# Patient Record
Sex: Female | Born: 1950 | Race: White | Hispanic: No | Marital: Married | Smoking: Former smoker
Health system: Southern US, Community
[De-identification: ages and names within clinical notes are randomized; demographics above are authoritative.]

## PROBLEM LIST (undated history)

## (undated) DIAGNOSIS — M8580 Other specified disorders of bone density and structure, unspecified site: Secondary | ICD-10-CM

## (undated) DIAGNOSIS — H409 Unspecified glaucoma: Secondary | ICD-10-CM

## (undated) DIAGNOSIS — J45909 Unspecified asthma, uncomplicated: Secondary | ICD-10-CM

## (undated) DIAGNOSIS — E7800 Pure hypercholesterolemia, unspecified: Secondary | ICD-10-CM

## (undated) DIAGNOSIS — K219 Gastro-esophageal reflux disease without esophagitis: Secondary | ICD-10-CM

## (undated) DIAGNOSIS — K589 Irritable bowel syndrome without diarrhea: Secondary | ICD-10-CM

## (undated) DIAGNOSIS — F329 Major depressive disorder, single episode, unspecified: Secondary | ICD-10-CM

## (undated) DIAGNOSIS — E049 Nontoxic goiter, unspecified: Secondary | ICD-10-CM

## (undated) DIAGNOSIS — F32A Depression, unspecified: Secondary | ICD-10-CM

## (undated) HISTORY — DX: Depression, unspecified: F32.A

## (undated) HISTORY — DX: Irritable bowel syndrome without diarrhea: K58.9

## (undated) HISTORY — DX: Unspecified asthma, uncomplicated: J45.909

## (undated) HISTORY — DX: Other specified disorders of bone density and structure, unspecified site: M85.80

## (undated) HISTORY — PX: TOTAL KNEE ARTHROPLASTY: SHX125

## (undated) HISTORY — DX: Major depressive disorder, single episode, unspecified: F32.9

## (undated) HISTORY — DX: Nontoxic goiter, unspecified: E04.9

## (undated) HISTORY — DX: Unspecified glaucoma: H40.9

## (undated) HISTORY — PX: COLONOSCOPY: SHX174

## (undated) HISTORY — PX: CHOLECYSTECTOMY: SHX55

## (undated) HISTORY — PX: ABDOMINAL HERNIA REPAIR: SHX539

## (undated) HISTORY — DX: Pure hypercholesterolemia, unspecified: E78.00

## (undated) HISTORY — DX: Gastro-esophageal reflux disease without esophagitis: K21.9

## (undated) HISTORY — PX: APPENDECTOMY: SHX54

---

## 2007-05-06 LAB — PULMONARY FUNCTION TEST

## 2010-02-01 ENCOUNTER — Inpatient Hospital Stay (HOSPITAL_COMMUNITY)
Admission: RE | Admit: 2010-02-01 | Discharge: 2010-02-04 | Payer: Self-pay | Source: Home / Self Care | Attending: Orthopedic Surgery | Admitting: Orthopedic Surgery

## 2010-02-08 LAB — BASIC METABOLIC PANEL
BUN: 5 mg/dL — ABNORMAL LOW (ref 6–23)
BUN: 5 mg/dL — ABNORMAL LOW (ref 6–23)
CO2: 31 mEq/L (ref 19–32)
CO2: 30 mEq/L (ref 19–32)
Calcium: 9 mg/dL (ref 8.4–10.5)
Calcium: 9.1 mg/dL (ref 8.4–10.5)
Chloride: 99 mEq/L (ref 96–112)
Chloride: 100 mEq/L (ref 96–112)
Creatinine, Ser: 0.74 mg/dL (ref 0.4–1.2)
Creatinine, Ser: 0.62 mg/dL (ref 0.4–1.2)
GFR calc Af Amer: >60 mL/min (ref >60)
GFR calc Af Amer: >60 mL/min (ref >60)
GFR calc non Af Amer: >60 mL/min (ref >60)
GFR calc non Af Amer: >60 mL/min (ref >60)
Glucose, Bld: 168 mg/dL — ABNORMAL HIGH (ref 70–99)
Glucose, Bld: 164 mg/dL — ABNORMAL HIGH (ref 70–99)
Potassium: 5 mEq/L (ref 3.5–5.1)
Potassium: 4.1 mEq/L (ref 3.5–5.1)
Sodium: 137 mEq/L (ref 135–145)
Sodium: 136 mEq/L (ref 135–145)

## 2010-02-08 LAB — TYPE AND SCREEN
ABO/RH(D): A POS
Antibody Screen: NEGATIVE

## 2010-02-08 LAB — CBC
HCT: 35.3 % — ABNORMAL LOW (ref 36.0–46.0)
HCT: 33.7 % — ABNORMAL LOW (ref 36.0–46.0)
HCT: 33 % — ABNORMAL LOW (ref 36.0–46.0)
Hemoglobin: 11.1 g/dL — ABNORMAL LOW (ref 12.0–15.0)
Hemoglobin: 10.8 g/dL — ABNORMAL LOW (ref 12.0–15.0)
Hemoglobin: 10.5 g/dL — ABNORMAL LOW (ref 12.0–15.0)
MCH: 29.4 pg (ref 26.0–34.0)
MCH: 29.7 pg (ref 26.0–34.0)
MCH: 29.4 pg (ref 26.0–34.0)
MCHC: 31.4 g/dL (ref 30.0–36.0)
MCHC: 32 g/dL (ref 30.0–36.0)
MCHC: 31.8 g/dL (ref 30.0–36.0)
MCV: 93.6 fL (ref 78.0–100.0)
MCV: 92.6 fL (ref 78.0–100.0)
MCV: 92.4 fL (ref 78.0–100.0)
Platelets: 237 10*3/uL (ref 150–400)
Platelets: 214 10*3/uL (ref 150–400)
Platelets: 232 10*3/uL (ref 150–400)
RBC: 3.77 MIL/uL — ABNORMAL LOW (ref 3.87–5.11)
RBC: 3.64 MIL/uL — ABNORMAL LOW (ref 3.87–5.11)
RBC: 3.57 MIL/uL — ABNORMAL LOW (ref 3.87–5.11)
RDW: 13.4 % (ref 11.5–15.5)
RDW: 13.2 % (ref 11.5–15.5)
RDW: 13.3 % (ref 11.5–15.5)
WBC: 11.6 10*3/uL — ABNORMAL HIGH (ref 4.0–10.5)
WBC: 13.3 10*3/uL — ABNORMAL HIGH (ref 4.0–10.5)
WBC: 12.8 10*3/uL — ABNORMAL HIGH (ref 4.0–10.5)

## 2010-02-08 LAB — PROTIME-INR
INR: 1.06 (ref 0.00–1.49)
INR: 1.15 (ref 0.00–1.49)
INR: 1.29 (ref 0.00–1.49)
Prothrombin Time: 14 seconds (ref 11.6–15.2)
Prothrombin Time: 14.9 seconds (ref 11.6–15.2)
Prothrombin Time: 16.3 seconds — ABNORMAL HIGH (ref 11.6–15.2)

## 2010-02-08 LAB — ABO/RH: ABO/RH(D): A POS

## 2010-02-24 NOTE — Discharge Summary (Signed)
Holly Odom, Holly Odom                ACCOUNT NO.:  [id number]  MEDICAL RECORD NO.:  [id number]          PATIENT TYPE:  INP  LOCATION:  1607                         FACILITY:  [facility]  PHYSICIAN:  Ollen Gross, M.D.    DATE OF BIRTH:  1950-12-19  DATE OF ADMISSION:  02/01/2010 DATE OF DISCHARGE:  02/04/2010                              DISCHARGE SUMMARY   ADMITTING DIAGNOSES: 1. Osteoarthritis, left knee. 2. Anxiety. 3. Open-angle glaucoma. 4. Hypercholesterolemia. 5. Varicose veins. 6. Hiatal hernia. 7. Reflux disease. 8. Hemorrhoids. 9. History of gallbladder disease. 10.Irritable bowel syndrome. 11.Urinary incontinence. 12.Past history of urinary tract infection. 13.Hypothyroidism. 14.Osteopenia. 15.History of fractures, fifth metatarsal 16.Degenerative disk disease. 17.Childhood illnesses of scarlet fever, measles, mumps and strep     infections. 18.Postmenopausal.  DISCHARGE DIAGNOSES: 1. Osteoarthritis, left knee status post left total knee replacement     arthroplasty. 2. Anxiety. 3. Open-angle glaucoma. 4. Hypercholesterolemia. 5. Varicose veins. 6. Hiatal hernia. 7. Reflux disease. 8. Hemorrhoids. 9. History of gallbladder disease. 10.Irritable bowel syndrome. 11.Urinary incontinence. 12.Past history of urinary tract infection. 13.Hypothyroidism. 14.Osteopenia. 15.History of fractures, fifth metatarsal 16.Degenerative disk disease. 17.Childhood illnesses of scarlet fever, measles, mumps and strep     infections. 18.Postmenopausal.  PROCEDURE:  February 01, 2010, left total knee.  SURGEON:  Ollen Gross, MD  ASSISTANT:  Rozell Searing, [facility]  ANESTHESIA:  General.  TOURNIQUET TIME:  37 minutes.  CONSULTS:  None.  BRIEF HISTORY:  [name] is a 60 year old female with end-stage arthritis, left knee, progressive worsening, pain, dysfunction, failed non-operative management now presents for total knee arthroplasty.  LABORATORY DATA:  Preoperative  CBC showed a hemoglobin of 12.7, hematocrit of 39.9, white cell count 8.4, platelets 279.  PT/INR 13.5 and 1.01 with a PTT of 27.  Chemistry panel on admission slightly elevated SGPT at 45.  Remaining chemistry panel within normal limits. Preoperative UA was negative.  Blood group type A+.  Nasal swabs were negative staph aureus and MRSA.  Serial CBCs were followed.  Hemoglobin dropped down to 11.1 and 10.8, which stabilized at 10.5 with a hematocrit of 33.0.  White count went up from 8.4 to 13.3 back down to 12.8.  Serial prothrombin times followed per Coumadin protocol.  Last done PT/INR 16.3 and 1.29.  She was given Lovenox prior to discharge. Serial BMETs were followed for 48 hours.  Electrolytes remained within normal limits.  Glucose did go up from 111 to 168, was trending back down and it is down to 164.  DIAGNOSTIC STUDIES:  EKG January 26, 2010, normal sinus rhythm, cannot rule out anterior infarct, age undetermined, confirmed by Dr. Willa Rough.  Please note that the patient had previous EKGs, one dating backto 2009 where she had the same EKG changes and they were unchanged and this was shown to anesthesia.  HOSPITAL COURSE:  The patient was admitted to [facility], taken to OR, underwent above-stated procedure without complication.  The patient tolerated procedure well, later transferred to recovery room, then orthopedic floor started on p.o. and IV analgesic.  Pain control following surgery, given 24 hours postoperative IV antibiotics.  She was started  back on her home medications.  Hemoglobin looked good at 11.1. Started to get up and out of bed on day 1.  She wanted to go home, so we got discharge planning involved.  By day 2, she was already walking about 60 feet.  She had a little bit of drop in her O2 saturations.  She had gotten down to the 80s in the evening and through the night, felt to be due to some sedation because she was back up in the 90s with O2  and also during the day when she was awake and alert.  So we started getting her up with therapy.  Hemoglobin was 10.8.  Dressing changed on day 2. Incision looked good.  No signs of infection.  She was seen back on rounds on day 3 and still has a little bit of drops in the 80s, but she was on room air and doing 90% when she was awake and alert.  Felt to be more of a sedation type issues.  She was progressing well with her therapy though, and she was meeting her goals and she wanted to go home.  DISCHARGE/PLAN: 1. The patient discharged home on February 04, 2010. 2. Discharge diagnoses:  Please see above.  DISCHARGE MEDICATIONS: 1. Percocet. 2. Robaxin. 3. Coumadin. 4. Lovenox. 5. Continue the citalopram. 6. Fenofibrate. 7. Toviaz. 8. Latanoprost ophthalmic drops. 9. Levothyroxine. 10.Omeprazole. 11.Pravachol.  DIET:  Heart-healthy diet, low-cholesterol diet.  ACTIVITY:  Weightbearing as tolerated.  Total knee protocol.  Home health PT and home health nursing.  FOLLOWUP:  In 2 weeks.  DISPOSITION:  Home.  CONDITION ON DISCHARGE:  Improved.     Alexzandrew L. Julien Girt, P.A.C.   ______________________________ Ollen Gross, M.D.    ALP/MEDQ  D:  02/04/2010  T:  02/04/2010  Job:  [id number]  cc:   Quita Skye. Artis Flock, M.D. Fax: [phone]  Electronically Signed by Patrica Duel P.A.C. on 02/11/2010 11:09:59 AM Electronically Signed by Ollen Gross M.D. on 02/24/2010 03:34:00 PM

## 2010-04-05 LAB — CBC
HCT: 39.9 % (ref 36.0–46.0)
Hemoglobin: 12.7 g/dL (ref 12.0–15.0)
MCH: 29.3 pg (ref 26.0–34.0)
MCHC: 31.8 g/dL (ref 30.0–36.0)
MCV: 91.9 fL (ref 78.0–100.0)
Platelets: 279 10*3/uL (ref 150–400)
RBC: 4.34 MIL/uL (ref 3.87–5.11)
RDW: 13.1 % (ref 11.5–15.5)
WBC: 8.4 10*3/uL (ref 4.0–10.5)

## 2010-04-05 LAB — SURGICAL PCR SCREEN
MRSA, PCR: NEGATIVE
Staphylococcus aureus: NEGATIVE

## 2010-04-05 LAB — COMPREHENSIVE METABOLIC PANEL
ALT: 45 U/L — ABNORMAL HIGH (ref 0–35)
AST: 36 U/L (ref 0–37)
Albumin: 3.8 g/dL (ref 3.5–5.2)
Alkaline Phosphatase: 62 U/L (ref 39–117)
BUN: 11 mg/dL (ref 6–23)
CO2: 28 mEq/L (ref 19–32)
Calcium: 9.7 mg/dL (ref 8.4–10.5)
Chloride: 105 mEq/L (ref 96–112)
Creatinine, Ser: 0.73 mg/dL (ref 0.4–1.2)
GFR calc Af Amer: >60 mL/min (ref >60)
GFR calc non Af Amer: >60 mL/min (ref >60)
Glucose, Bld: 111 mg/dL — ABNORMAL HIGH (ref 70–99)
Potassium: 3.7 mEq/L (ref 3.5–5.1)
Sodium: 143 mEq/L (ref 135–145)
Total Bilirubin: 0.5 mg/dL (ref 0.3–1.2)
Total Protein: 6.9 g/dL (ref 6.0–8.3)

## 2010-04-05 LAB — URINALYSIS, ROUTINE W REFLEX MICROSCOPIC
Bilirubin Urine: NEGATIVE
Glucose, UA: NEGATIVE mg/dL
Hgb urine dipstick: NEGATIVE
Ketones, ur: NEGATIVE mg/dL
Nitrite: NEGATIVE
Protein, ur: NEGATIVE mg/dL
Specific Gravity, Urine: 1.014 (ref 1.005–1.030)
Urobilinogen, UA: 0.2 mg/dL (ref 0.0–1.0)
pH: 6.5 (ref 5.0–8.0)

## 2010-04-05 LAB — PROTIME-INR
INR: 1.01 (ref 0.00–1.49)
Prothrombin Time: 13.5 seconds (ref 11.6–15.2)

## 2010-04-05 LAB — APTT: aPTT: 27 seconds (ref 24–37)

## 2011-09-08 ENCOUNTER — Encounter: Payer: Self-pay | Admitting: *Deleted

## 2011-09-09 ENCOUNTER — Encounter: Payer: Self-pay | Admitting: Internal Medicine

## 2011-09-09 ENCOUNTER — Ambulatory Visit (INDEPENDENT_AMBULATORY_CARE_PROVIDER_SITE_OTHER): Payer: BC Managed Care – PPO | Admitting: Internal Medicine

## 2011-09-09 VITALS — BP 102/62 | HR 83 | Temp 97.6°F | Ht 62.0 in | Wt 175.4 lb

## 2011-09-09 DIAGNOSIS — J310 Chronic rhinitis: Secondary | ICD-10-CM

## 2011-09-09 DIAGNOSIS — R05 Cough: Secondary | ICD-10-CM | POA: Insufficient documentation

## 2011-09-09 MED ORDER — TRAMADOL HCL 50 MG PO TABS: ORAL_TABLET | ORAL | Status: AC

## 2011-09-09 MED ORDER — PREDNISONE (PAK) 10 MG PO TABS: ORAL_TABLET | ORAL | Status: AC

## 2011-09-09 MED ORDER — OMEPRAZOLE 20 MG PO CPDR: DELAYED_RELEASE_CAPSULE | ORAL | Status: DC

## 2011-09-09 MED ORDER — MONTELUKAST SODIUM 10 MG PO TABS: 10.0000 mg | ORAL_TABLET | Freq: Every day | ORAL | Status: DC

## 2011-09-09 MED ORDER — FAMOTIDINE 20 MG PO TABS: ORAL_TABLET | ORAL | Status: AC

## 2011-09-09 NOTE — Assessment & Plan Note (Signed)
This may be the "spark" but not the fuel for the chronic cough  Since may have responded to singulair previously and can't cause a cough, ok to resume

## 2011-09-09 NOTE — Patient Instructions (Addendum)
The key to effective treatment for your cough is eliminating the non-stop cycle of cough you're stuck in long enough to let your airway heal completely and then see if there is anything still making you cough once you stop the cough suppression, but this should take no more than 5 days to figuure out  First take delsym two tsp every 12 hours and supplement if needed with  tramadol 50 mg up to 2 every 4 hours to suppress the urge to cough. Swallowing water or using ice chips/non mint and menthol containing candies (such as lifesavers or sugarless jolly ranchers) are also effective.  You should rest your voice and avoid activities that you know make you cough.  Once you have eliminated the cough for 3 straight days try reducing the tramadol first,  then the delsym as tolerated.    Try prilosec 20mg   Take 30-60 min before first  And last meal of the day and Pepcid 20 mg one bedtime   GERD (REFLUX)  is an extremely common cause of respiratory symptoms, many times with no significant heartburn at all.    It can be treated with medication, but also with lifestyle changes including avoidance of late meals, excessive alcohol, smoking cessation, and avoid fatty foods, chocolate, peppermint, colas, red wine, and acidic juices such as orange juice.  NO MINT OR MENTHOL PRODUCTS SO NO COUGH DROPS  USE SUGARLESS CANDY INSTEAD (jolley ranchers or Stover's)  NO OIL BASED VITAMINS - use powdered substitutes.    Prednisone 10 mg take  4 each am x 2 days,   2 each am x 2 days,  1 each am x2days and stop    Resume singulair 10 mg daily  The main indication for wearing cpap is daytime drowsiness > if this becomes a problem I would be happy to refer you to one of our specialists  Please schedule a follow up office visit in 6 weeks, call sooner if needed

## 2011-09-09 NOTE — Progress Notes (Signed)
  Subjective:    Patient ID: Holly Odom, female    DOB: Sep 18, 1950  MRN: [id number]  HPI  78 yowf grew up in house of smokers and quit in 1985 with no resp whatsoever until recurrent sinus problems since 1987 spring/fall then Nov 2012 with onset of cough and referred 09/09/2011 to pulmonary clinic.  09/09/2011 1st pulmonary eval for persistent cough since Nov 2012 most noticeable in am tiny amt of amt of mucus not better from  abx but prednisone needed no sinus CT, worse symbicort and bending over makes it worse, takes protonix at hs.  Gets so bad coughs/gags/urinary incont when severe but never more productive.  singulair may have helped sinuses but didn't maintain it.  Sleeping ok without nocturnal  or early am exacerbation  of respiratory  c/o's or need for noct saba. Also denies any obvious fluctuation of symptoms with weather or environmental changes or other aggravating or alleviating factors except as outlined above     Review of Systems  Constitutional: Negative for fever, chills, diaphoresis, activity change, appetite change, fatigue and unexpected weight change.  HENT: Positive for congestion, rhinorrhea, sneezing, postnasal drip and sinus pressure. Negative for nosebleeds, sore throat, trouble swallowing, voice change and tinnitus.   Eyes: Negative for visual disturbance.  Respiratory: Positive for cough, shortness of breath and wheezing. Negative for choking, chest tightness and stridor.   Cardiovascular: Negative for chest pain and palpitations.  Gastrointestinal: Positive for constipation. Negative for nausea, vomiting and blood in stool.  Genitourinary: Negative for difficulty urinating.  Musculoskeletal: Negative for gait problem.  Skin: Negative for rash.  Neurological: Negative for dizziness, tremors, weakness, light-headedness, numbness and headaches.  Hematological: Does not bruise/bleed easily.  Psychiatric/Behavioral: Positive for disturbed wake/sleep cycle. Negative for  confusion and agitation. The patient is not nervous/anxious.        Objective:   Physical Exam  amb wf nad    HEENT: nl dentition, turbinates, and orophanx. Nl external ear canals without cough reflex    NECK :  without JVD/Nodes/TM/ nl carotid upstrokes bilaterally   LUNGS: no acc muscle use, clear to A and P bilaterally without cough on insp or exp maneuvers   CV:  RRR  no s3 or murmur or increase in P2, no edema   ABD:  soft and nontender with nl excursion in the supine position. No bruits or organomegaly, bowel sounds nl  MS:  warm without deformities, calf tenderness, cyanosis or clubbing  SKIN: warm and dry without lesions    NEURO:  alert, approp, no deficits  CT chest 03/18/11 No active abnormalities per rad report from [facility]       Assessment & Plan:

## 2011-09-09 NOTE — Assessment & Plan Note (Addendum)
The most common causes of chronic cough in immunocompetent adults include the following: upper airway cough syndrome (UACS), previously referred to as postnasal drip syndrome (PNDS), which is caused by variety of rhinosinus conditions; (2) asthma; (3) GERD; (4) chronic bronchitis from cigarette smoking or other inhaled environmental irritants; (5) nonasthmatic eosinophilic bronchitis; and (6) bronchiectasis.   These conditions, singly or in combination, have accounted for up to 94% of the causes of chronic cough in prospective studies.   Other conditions have constituted no >6% of the causes in prospective studies These have included bronchogenic carcinoma, chronic interstitial pneumonia, sarcoidosis, left ventricular failure, ACEI-induced cough, and aspiration from a condition associated with pharyngeal dysfunction.   .Chronic cough is often simultaneously caused by more than one condition. A single cause has been found from 38 to 82% of the time, multiple causes from 18 to 62%. Multiply caused cough has been the result of three diseases up to 42% of the time.     Most likely this is nothing more than  Classic Upper airway cough syndrome, so named because it's frequently impossible to sort out how much is  CR/sinusitis with freq throat clearing (which can be related to primary GERD)   vs  causing  secondary (" extra esophageal")  GERD from wide swings in gastric pressure that occur with throat clearing, often  promoting self use of mint and menthol lozenges that reduce the lower esophageal sphincter tone and exacerbate the problem further in a cyclical fashion.   These are the same pts (now being labeled as having "irritable larynx syndrome" by some cough centers) who not infrequently have a history of having failed to tolerate ace inhibitors,  dry powder inhalers (or even hfa inhalers, as me be the case here)or biphosphonates or report having atypical reflux symptoms that don't respond to standard doses  of PPI , and are easily confused as having aecopd or asthma flares by even experienced allergists/ pulmonologists.  Discussed with pt The standardized cough guidelines recently published in Chest by [place] in 2006  are a multiple step process (up to 12!) , not a single office visit,  and are intended  to address this problem logically,  with an alogrithm dependent on response to empiric treatment at  each progressive step  to determine a specific diagnosis with  minimal addtional testing needed. Therefore if compliance is an issue or can't be accurately verified then it's very unlikely the standard evaluation and treatment will be successful here.    Furthermore, response to therapy (other than acute cough suppression, which should only be used short term with avoidance of narcotic containing cough syrups if possible), can be a gradual process for which the patient may not receive immediate benefit.  Unlike going to an eye doctor where the right rx is almost always the first one and is immediately effective, this is almost never the case in the management of chronic cough syndromes and the patient needs to commit up front to compliance with recommendations and have the patience to wait out a response for up to 6 weeks of therapy directed at the likely underlying problem(s).    For now rx max for gerd then sinus ct next step if not improving.

## 2011-10-25 ENCOUNTER — Ambulatory Visit (INDEPENDENT_AMBULATORY_CARE_PROVIDER_SITE_OTHER)
Admission: RE | Admit: 2011-10-25 | Discharge: 2011-10-25 | Disposition: A | Discharge status: Home / Self Care | Payer: BC Managed Care – PPO | Source: Ambulatory Visit | Attending: Internal Medicine | Admitting: Internal Medicine

## 2011-10-25 ENCOUNTER — Encounter: Payer: Self-pay | Admitting: Internal Medicine

## 2011-10-25 ENCOUNTER — Ambulatory Visit (INDEPENDENT_AMBULATORY_CARE_PROVIDER_SITE_OTHER): Payer: BC Managed Care – PPO | Admitting: Internal Medicine

## 2011-10-25 VITALS — BP 102/68 | HR 90 | Temp 97.8°F | Ht 62.0 in | Wt 172.6 lb

## 2011-10-25 DIAGNOSIS — R05 Cough: Secondary | ICD-10-CM

## 2011-10-25 DIAGNOSIS — Z23 Encounter for immunization: Secondary | ICD-10-CM

## 2011-10-25 DIAGNOSIS — J310 Chronic rhinitis: Secondary | ICD-10-CM

## 2011-10-25 MED ORDER — TRAMADOL HCL 50 MG PO TABS: 50.0000 mg | ORAL_TABLET | Freq: Four times a day (QID) | ORAL | Status: AC | PRN

## 2011-10-25 NOTE — Patient Instructions (Addendum)
Try add back zyrtec 10 mg one at bedtime to see what effect it has on your am cough  Please see patient coordinator before you leave today  to schedule sinus ct  Continue high suppression prilosec 40 mg Take 30-60 min before first meal of the day and famotidine 20mg  one at bedtime  Please schedule a follow up office visit in 4 weeks, sooner if needed

## 2011-10-25 NOTE — Progress Notes (Signed)
Subjective:    Patient ID: Holly Odom, female    DOB: 06/01/1950  MRN: [id number]  HPI  13 yowf grew up in house of smokers and quit in 1985 with no resp whatsoever until recurrent sinus problems since 1987 spring/fall then Nov 2012 with onset of cough and referred 09/09/2011 to pulmonary clinic.  09/09/2011 1st pulmonary eval for persistent cough since Nov 2012 most noticeable in am tiny amt of mucus not better from  abx but prednisone needed no sinus CT, worse symbicort and bending over makes it worse, takes protonix at hs.  Gets so bad coughs/gags/urinary incont when severe but never more productive.  singulair may have helped sinuses but didn't maintain it. rec The key to effective treatment for your cough is eliminating the non-stop cycle of cough you're stuck in long enough to let your airway heal completely and then see if there is anything still making you cough once you stop the cough suppression, but this should take no more than 5 days to figure out First take delsym two tsp every 12 hours and supplement if needed with  tramadol 50 mg up to 2 every 4 hours to suppress the urge to cough  Try prilosec 20mg   Take 30-60 min before first  And last meal of the day and Pepcid 20 mg one bedtime  GERD diet Prednisone 10 mg take  4 each am x 2 days,   2 each am x 2 days,  1 each am x2days and stop   Resume singulair 10 mg daily The main indication for wearing cpap is daytime drowsiness > if this becomes a problem I would be happy to refer you to one of our specialists   10/25/2011 f/u ov/Renu Asby cc a little raspy cough in am p stirring around, min sputum production,   Better p drink something, no longer taking zyrtec. No obvious daytime variabilty or assoc sob  cp or chest tightness, subjective wheeze overt sinus or hb symptoms. No unusual exp hx    Sleeping ok without nocturnal  or early am exacerbation  of respiratory  c/o's or need for noct saba. Also denies any obvious fluctuation of symptoms  with weather or environmental changes or other aggravating or alleviating factors except as outlined above   ROS  The following are not active complaints unless bolded sore throat, dysphagia, dental problems, itching, sneezing,  nasal congestion or excess/ purulent secretions, ear ache,   fever, chills, sweats, unintended wt loss, pleuritic or exertional cp, hemoptysis,  orthopnea pnd or leg swelling, presyncope, palpitations, heartburn, abdominal pain, anorexia, nausea, vomiting, diarrhea  or change in bowel or urinary habits, change in stools or urine, dysuria,hematuria,  rash, arthralgias, visual complaints, headache, numbness weakness or ataxia or problems with walking or coordination,  change in mood/affect or memory.            .        Objective:   Physical Exam  Wt Readings from Last 3 Encounters:  10/25/11 172 lb 9.6 oz (78.291 kg)  09/09/11 175 lb 6.4 oz (79.561 kg)     amb wf nad    HEENT: nl dentition, turbinates, and orophanx. Nl external ear canals without cough reflex    NECK :  without JVD/Nodes/TM/ nl carotid upstrokes bilaterally   LUNGS: no acc muscle use, clear to A and P bilaterally without cough on insp or exp maneuvers   CV:  RRR  no s3 or murmur or increase in P2, no edema   ABD:  soft and nontender with nl excursion in the supine position. No bruits or organomegaly, bowel sounds nl  MS:  warm without deformities, calf tenderness, cyanosis or clubbing    CT chest 03/18/11 No active abnormalities per rad report from [facility]       Assessment & Plan:

## 2011-10-26 NOTE — Progress Notes (Signed)
Quick Note:  Spoke with pt and notified of results per Dr. Wert. Pt verbalized understanding and denied any questions.  ______ 

## 2011-10-28 NOTE — Assessment & Plan Note (Signed)
-   PFTs wnl 05/06/2007 - Sinus CT ok 10/25/11  Still feel this is most c/w  Classic Upper airway cough syndrome, so named because it's frequently impossible to sort out how much is  CR/sinusitis with freq throat clearing (which can be related to primary GERD)   vs  causing  secondary (" extra esophageal")  GERD from wide swings in gastric pressure that occur with throat clearing, often  promoting self use of mint and menthol lozenges that reduce the lower esophageal sphincter tone and exacerbate the problem further in a cyclical fashion.   These are the same pts (now being labeled as having "irritable larynx syndrome" by some cough centers) who not infrequently have a history of having failed to tolerate ace inhibitors,  dry powder inhalers or biphosphonates or report having atypical reflux symptoms that don't respond to standard doses of PPI , and are easily confused as having aecopd or asthma flares by even experienced allergists/ pulmonologists.  Will therefore continue high dose acid suppression and add zyrtec qhs (other option chlortrimeton per guidelines) as she has at least partially responded to rx to date.

## 2011-10-28 NOTE — Assessment & Plan Note (Signed)
-   singulair maint rx 09/09/2011 >>>    - Sinus CT 10/25/11 > Clear paranasal sinuses  Add zyrtec back prn

## 2011-11-22 ENCOUNTER — Encounter: Payer: Self-pay | Admitting: Internal Medicine

## 2011-11-24 ENCOUNTER — Telehealth: Payer: Self-pay | Admitting: Internal Medicine

## 2011-11-24 MED ORDER — MONTELUKAST SODIUM 10 MG PO TABS: 10.0000 mg | ORAL_TABLET | Freq: Every day | ORAL | Status: AC

## 2011-11-24 NOTE — Telephone Encounter (Signed)
Rx has been sent to [facility]. Pt aware.

## 2011-11-25 ENCOUNTER — Ambulatory Visit: Payer: BC Managed Care – PPO | Admitting: Internal Medicine

## 2011-12-09 ENCOUNTER — Ambulatory Visit: Payer: BC Managed Care – PPO | Admitting: Internal Medicine

## 2012-01-11 ENCOUNTER — Ambulatory Visit: Payer: BC Managed Care – PPO | Admitting: Internal Medicine

## 2012-11-29 ENCOUNTER — Other Ambulatory Visit: Payer: Self-pay

## 2014-05-28 IMAGING — CT CT PARANASAL SINUSES LIMITED
1 series · 16 of 27 positions shown, 20 images · non-contrast
Comparison: None.

CLINICAL DATA: .  Sinus drainage and cough.

CT LIMITED SINUSES WITHOUT CONTRAST
TECHNIQUE: Multidetector CT images of the paranasal sinuses were
obtained in a single plane without contrast.

[Series 3: ltd sinus 3.0 h30s · axial · 0.37mm/px · z∈[+987,+1139]mm · 16 of 27 slices shown, 20 images]
[im 2/27  brain]
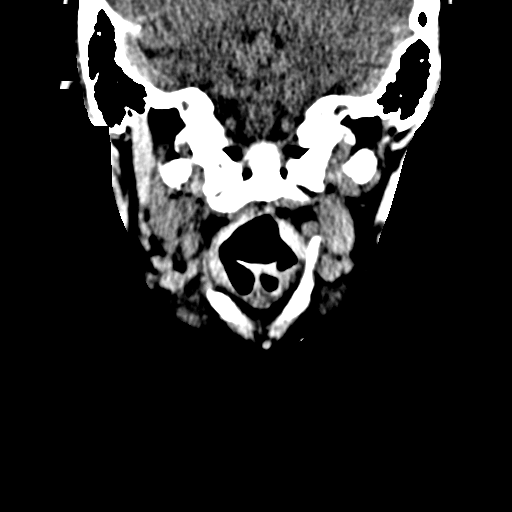
[im 2/27  bone]
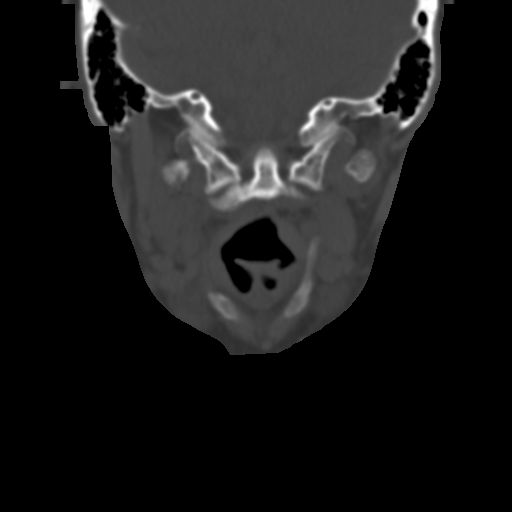
[im 4/27  bone]
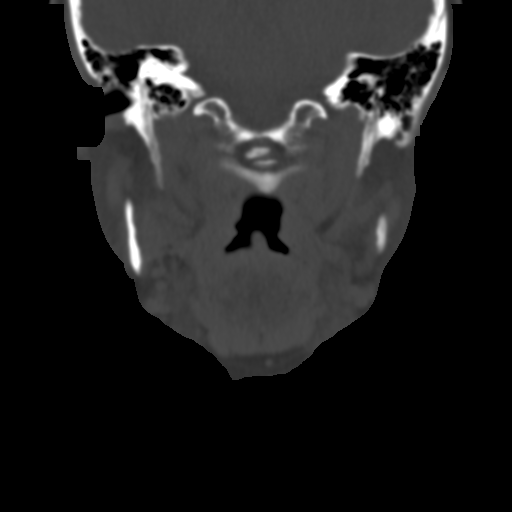
[im 5/27  bone]
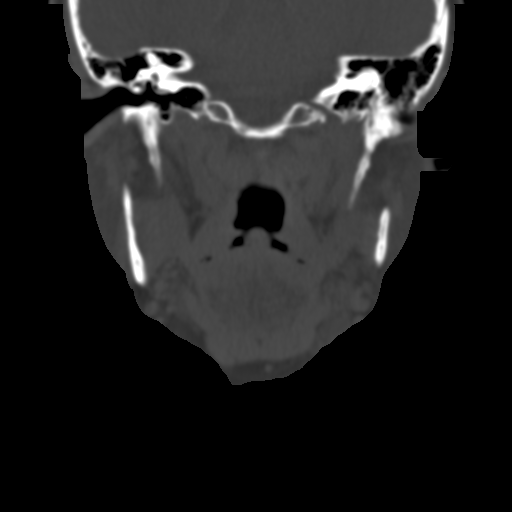
[im 7/27  bone]
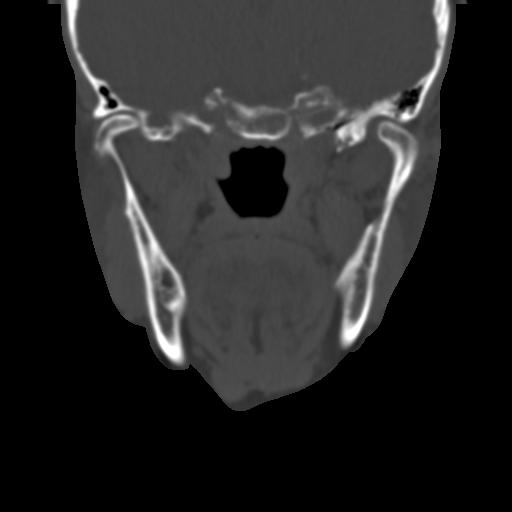
[im 9/27  brain]
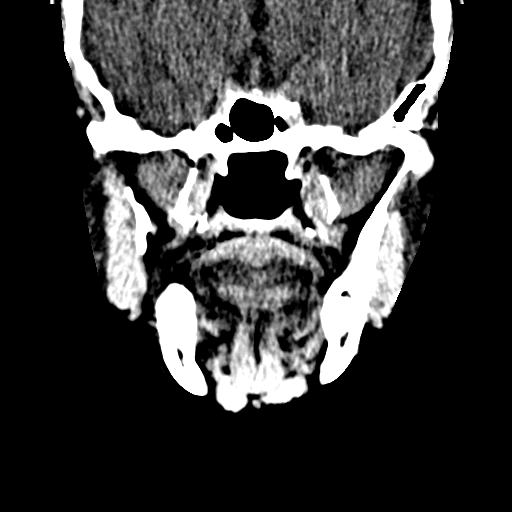
[im 9/27  bone]
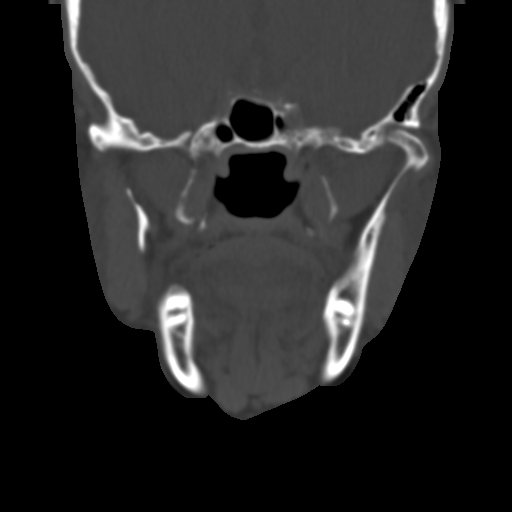
[im 10/27  bone]
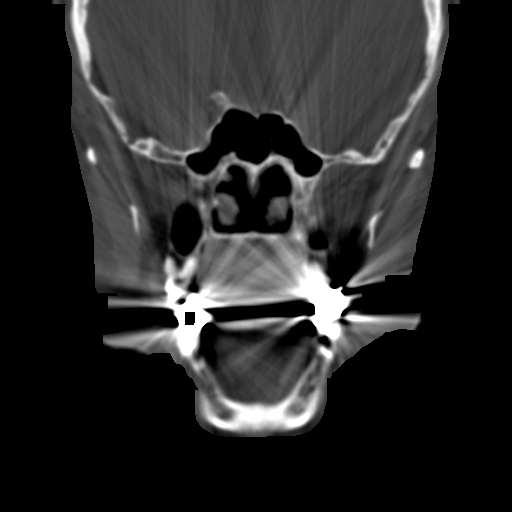
[im 12/27  bone]
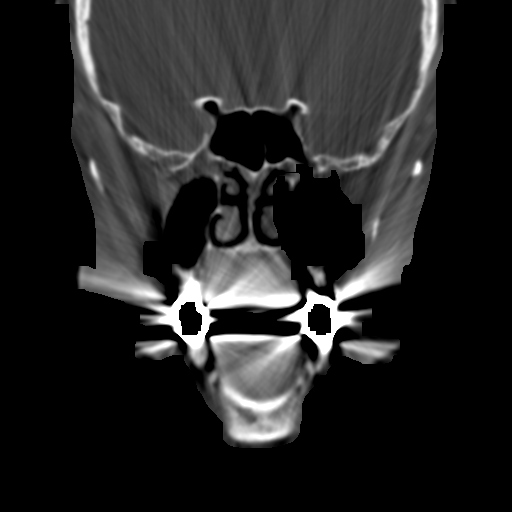
[im 13/27  bone]
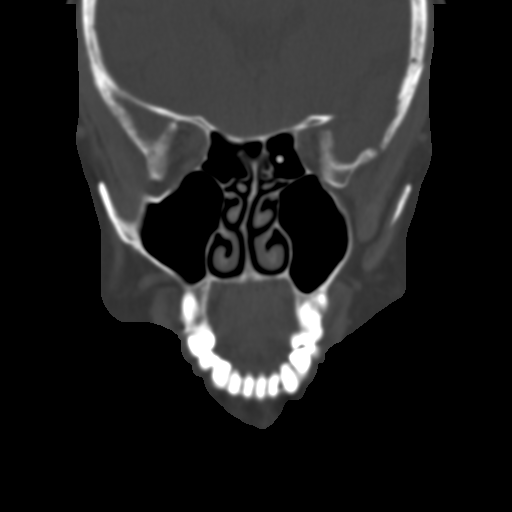
[im 15/27  brain]
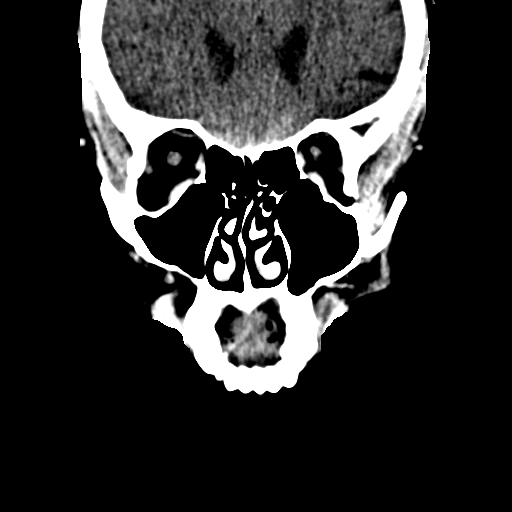
[im 15/27  bone]
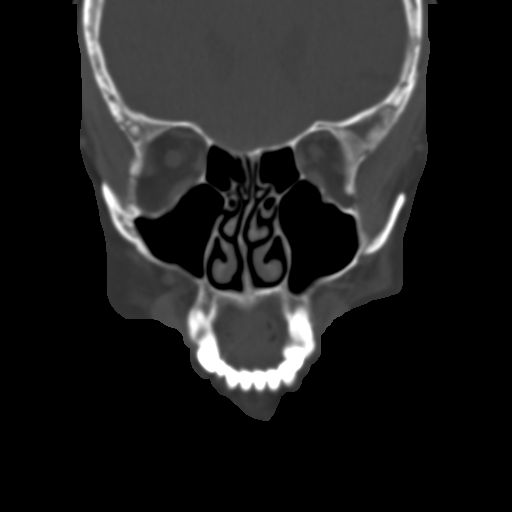
[im 16/27  bone]
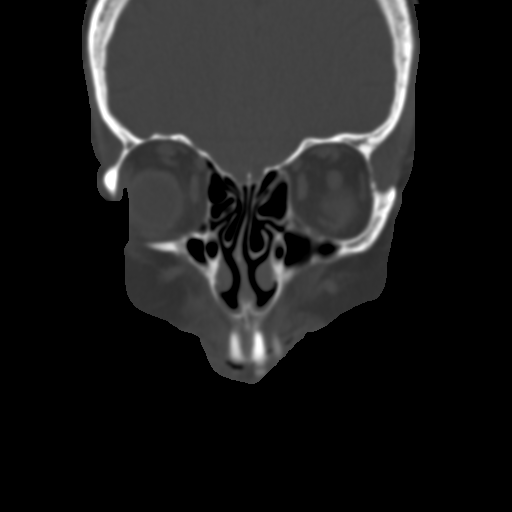
[im 18/27  bone]
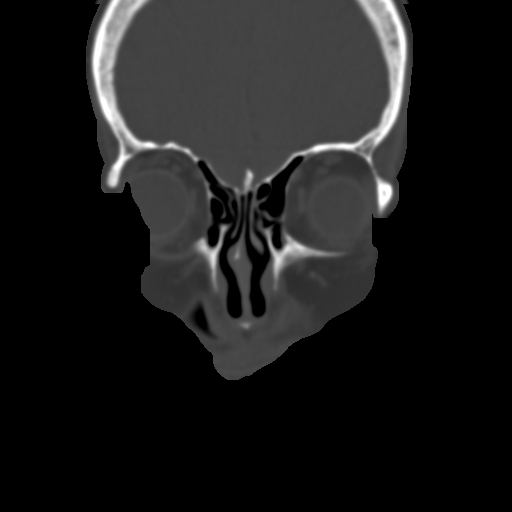
[im 19/27  bone]
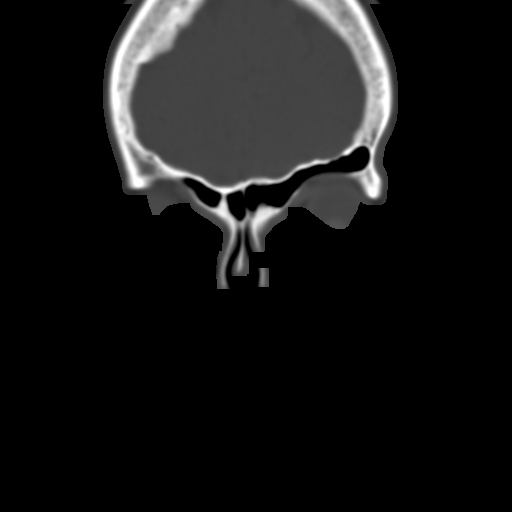
[im 21/27  brain]
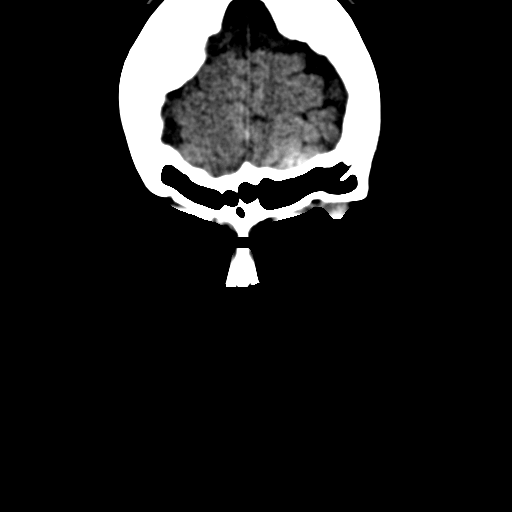
[im 21/27  bone]
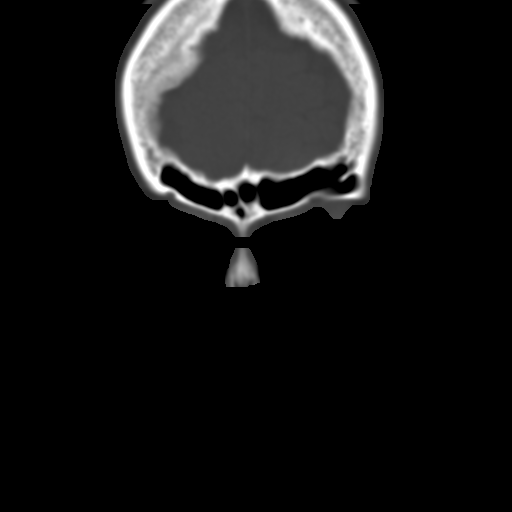
[im 23/27  bone]
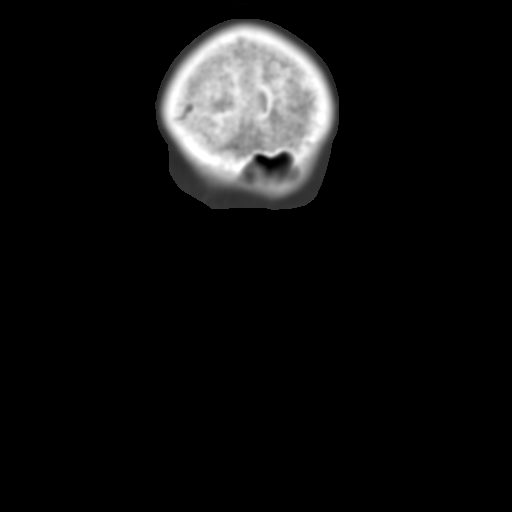
[im 24/27  bone]
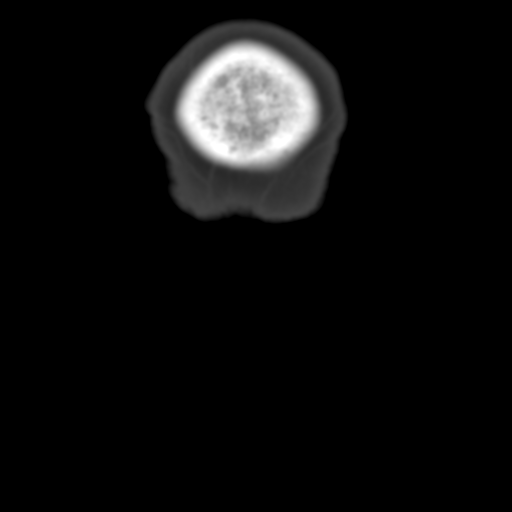
[im 26/27  bone]
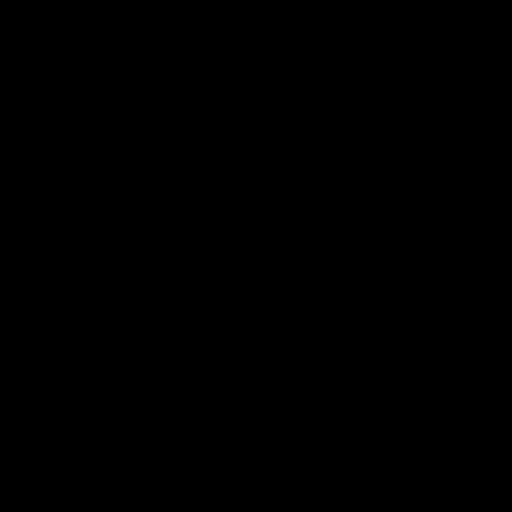

[16 of 27 positions shown; findings below may reference images not displayed]

FINDINGS: The paranasal sinuses are clear.

There is rightward deviation of the bony nasal septum and mild
narrowing of the right middle meatus. Bilateral Haller cells are
noted.  The ostiomeatal complexes are patent.  The mastoid air
cells are clear.

Degenerative changes noted involving both mandibular condyles.
IMPRESSION: Clear paranasal sinuses.

## 2016-02-05 DIAGNOSIS — J329 Chronic sinusitis, unspecified: Secondary | ICD-10-CM | POA: Diagnosis not present

## 2016-02-05 DIAGNOSIS — J40 Bronchitis, not specified as acute or chronic: Secondary | ICD-10-CM | POA: Diagnosis not present

## 2016-02-05 DIAGNOSIS — R7302 Impaired glucose tolerance (oral): Secondary | ICD-10-CM | POA: Diagnosis not present

## 2016-02-05 DIAGNOSIS — Z79899 Other long term (current) drug therapy: Secondary | ICD-10-CM | POA: Diagnosis not present

## 2016-02-05 DIAGNOSIS — E039 Hypothyroidism, unspecified: Secondary | ICD-10-CM | POA: Diagnosis not present

## 2016-03-12 DIAGNOSIS — R040 Epistaxis: Secondary | ICD-10-CM | POA: Diagnosis not present

## 2016-03-15 DIAGNOSIS — R040 Epistaxis: Secondary | ICD-10-CM | POA: Diagnosis not present

## 2016-03-16 DIAGNOSIS — R040 Epistaxis: Secondary | ICD-10-CM | POA: Diagnosis not present

## 2016-03-31 DIAGNOSIS — R040 Epistaxis: Secondary | ICD-10-CM | POA: Diagnosis not present

## 2016-04-07 DIAGNOSIS — R300 Dysuria: Secondary | ICD-10-CM | POA: Diagnosis not present

## 2016-04-18 DIAGNOSIS — R040 Epistaxis: Secondary | ICD-10-CM | POA: Diagnosis not present

## 2016-05-04 DIAGNOSIS — H401121 Primary open-angle glaucoma, left eye, mild stage: Secondary | ICD-10-CM | POA: Diagnosis not present

## 2016-07-12 DIAGNOSIS — R040 Epistaxis: Secondary | ICD-10-CM | POA: Diagnosis not present

## 2016-08-11 DIAGNOSIS — H401131 Primary open-angle glaucoma, bilateral, mild stage: Secondary | ICD-10-CM | POA: Diagnosis not present

## 2016-08-11 DIAGNOSIS — H5213 Myopia, bilateral: Secondary | ICD-10-CM | POA: Diagnosis not present

## 2016-08-30 DIAGNOSIS — R202 Paresthesia of skin: Secondary | ICD-10-CM | POA: Diagnosis not present

## 2016-08-30 DIAGNOSIS — Z6831 Body mass index (BMI) 31.0-31.9, adult: Secondary | ICD-10-CM | POA: Diagnosis not present

## 2016-08-31 DIAGNOSIS — E039 Hypothyroidism, unspecified: Secondary | ICD-10-CM | POA: Diagnosis not present

## 2016-08-31 DIAGNOSIS — Z79899 Other long term (current) drug therapy: Secondary | ICD-10-CM | POA: Diagnosis not present

## 2016-08-31 DIAGNOSIS — R7309 Other abnormal glucose: Secondary | ICD-10-CM | POA: Diagnosis not present

## 2016-08-31 DIAGNOSIS — E785 Hyperlipidemia, unspecified: Secondary | ICD-10-CM | POA: Diagnosis not present

## 2016-09-23 DIAGNOSIS — Z6832 Body mass index (BMI) 32.0-32.9, adult: Secondary | ICD-10-CM | POA: Diagnosis not present

## 2016-09-23 DIAGNOSIS — J45909 Unspecified asthma, uncomplicated: Secondary | ICD-10-CM | POA: Diagnosis not present

## 2016-09-23 DIAGNOSIS — Z124 Encounter for screening for malignant neoplasm of cervix: Secondary | ICD-10-CM | POA: Diagnosis not present

## 2016-09-23 DIAGNOSIS — Z9181 History of falling: Secondary | ICD-10-CM | POA: Diagnosis not present

## 2016-09-23 DIAGNOSIS — K219 Gastro-esophageal reflux disease without esophagitis: Secondary | ICD-10-CM | POA: Diagnosis not present

## 2016-09-23 DIAGNOSIS — E039 Hypothyroidism, unspecified: Secondary | ICD-10-CM | POA: Diagnosis not present

## 2016-09-23 DIAGNOSIS — E782 Mixed hyperlipidemia: Secondary | ICD-10-CM | POA: Diagnosis not present

## 2016-09-23 DIAGNOSIS — Z0000 Encounter for general adult medical examination without abnormal findings: Secondary | ICD-10-CM | POA: Diagnosis not present

## 2016-10-19 DIAGNOSIS — R200 Anesthesia of skin: Secondary | ICD-10-CM | POA: Diagnosis not present

## 2016-10-19 DIAGNOSIS — G5601 Carpal tunnel syndrome, right upper limb: Secondary | ICD-10-CM | POA: Diagnosis not present

## 2016-10-19 DIAGNOSIS — G5603 Carpal tunnel syndrome, bilateral upper limbs: Secondary | ICD-10-CM | POA: Diagnosis not present

## 2016-10-19 DIAGNOSIS — G5602 Carpal tunnel syndrome, left upper limb: Secondary | ICD-10-CM | POA: Diagnosis not present

## 2016-11-16 DIAGNOSIS — H401131 Primary open-angle glaucoma, bilateral, mild stage: Secondary | ICD-10-CM | POA: Diagnosis not present

## 2016-11-21 DIAGNOSIS — G5602 Carpal tunnel syndrome, left upper limb: Secondary | ICD-10-CM | POA: Diagnosis not present

## 2016-11-21 DIAGNOSIS — G5601 Carpal tunnel syndrome, right upper limb: Secondary | ICD-10-CM | POA: Diagnosis not present

## 2016-12-28 DIAGNOSIS — H401131 Primary open-angle glaucoma, bilateral, mild stage: Secondary | ICD-10-CM | POA: Diagnosis not present

## 2017-01-11 DIAGNOSIS — L821 Other seborrheic keratosis: Secondary | ICD-10-CM | POA: Diagnosis not present

## 2017-01-11 DIAGNOSIS — B079 Viral wart, unspecified: Secondary | ICD-10-CM | POA: Diagnosis not present

## 2017-01-11 DIAGNOSIS — L578 Other skin changes due to chronic exposure to nonionizing radiation: Secondary | ICD-10-CM | POA: Diagnosis not present

## 2017-01-11 DIAGNOSIS — L820 Inflamed seborrheic keratosis: Secondary | ICD-10-CM | POA: Diagnosis not present

## 2017-01-11 DIAGNOSIS — L301 Dyshidrosis [pompholyx]: Secondary | ICD-10-CM | POA: Diagnosis not present

## 2017-01-13 DIAGNOSIS — Z1231 Encounter for screening mammogram for malignant neoplasm of breast: Secondary | ICD-10-CM | POA: Diagnosis not present

## 2017-01-20 DIAGNOSIS — G5603 Carpal tunnel syndrome, bilateral upper limbs: Secondary | ICD-10-CM | POA: Diagnosis not present

## 2017-02-02 DIAGNOSIS — M25632 Stiffness of left wrist, not elsewhere classified: Secondary | ICD-10-CM | POA: Diagnosis not present

## 2017-02-02 DIAGNOSIS — M25631 Stiffness of right wrist, not elsewhere classified: Secondary | ICD-10-CM | POA: Diagnosis not present

## 2017-02-16 DIAGNOSIS — R040 Epistaxis: Secondary | ICD-10-CM | POA: Diagnosis not present

## 2017-02-16 DIAGNOSIS — M25631 Stiffness of right wrist, not elsewhere classified: Secondary | ICD-10-CM | POA: Diagnosis not present

## 2017-02-16 DIAGNOSIS — M25632 Stiffness of left wrist, not elsewhere classified: Secondary | ICD-10-CM | POA: Diagnosis not present

## 2017-03-02 DIAGNOSIS — H353131 Nonexudative age-related macular degeneration, bilateral, early dry stage: Secondary | ICD-10-CM | POA: Diagnosis not present

## 2017-05-09 DIAGNOSIS — E785 Hyperlipidemia, unspecified: Secondary | ICD-10-CM | POA: Diagnosis not present

## 2017-05-09 DIAGNOSIS — E039 Hypothyroidism, unspecified: Secondary | ICD-10-CM | POA: Diagnosis not present

## 2017-05-09 DIAGNOSIS — Z79899 Other long term (current) drug therapy: Secondary | ICD-10-CM | POA: Diagnosis not present

## 2017-05-09 DIAGNOSIS — R7302 Impaired glucose tolerance (oral): Secondary | ICD-10-CM | POA: Diagnosis not present

## 2017-05-09 DIAGNOSIS — E559 Vitamin D deficiency, unspecified: Secondary | ICD-10-CM | POA: Diagnosis not present

## 2017-05-17 DIAGNOSIS — E039 Hypothyroidism, unspecified: Secondary | ICD-10-CM | POA: Diagnosis not present

## 2017-05-17 DIAGNOSIS — E559 Vitamin D deficiency, unspecified: Secondary | ICD-10-CM | POA: Diagnosis not present

## 2017-05-17 DIAGNOSIS — E782 Mixed hyperlipidemia: Secondary | ICD-10-CM | POA: Diagnosis not present

## 2017-05-17 DIAGNOSIS — R7303 Prediabetes: Secondary | ICD-10-CM | POA: Diagnosis not present

## 2017-05-17 DIAGNOSIS — Z1331 Encounter for screening for depression: Secondary | ICD-10-CM | POA: Diagnosis not present

## 2017-05-17 DIAGNOSIS — Z6830 Body mass index (BMI) 30.0-30.9, adult: Secondary | ICD-10-CM | POA: Diagnosis not present

## 2017-05-19 DIAGNOSIS — H02819 Retained foreign body in unspecified eye, unspecified eyelid: Secondary | ICD-10-CM | POA: Diagnosis not present

## 2017-05-24 DIAGNOSIS — H1830 Unspecified corneal membrane change: Secondary | ICD-10-CM | POA: Diagnosis not present

## 2017-06-02 DIAGNOSIS — H401131 Primary open-angle glaucoma, bilateral, mild stage: Secondary | ICD-10-CM | POA: Diagnosis not present

## 2017-06-28 DIAGNOSIS — Z6831 Body mass index (BMI) 31.0-31.9, adult: Secondary | ICD-10-CM | POA: Diagnosis not present

## 2017-06-28 DIAGNOSIS — R0982 Postnasal drip: Secondary | ICD-10-CM | POA: Diagnosis not present

## 2017-06-28 DIAGNOSIS — J069 Acute upper respiratory infection, unspecified: Secondary | ICD-10-CM | POA: Diagnosis not present

## 2017-06-28 DIAGNOSIS — B9789 Other viral agents as the cause of diseases classified elsewhere: Secondary | ICD-10-CM | POA: Diagnosis not present

## 2017-08-23 DIAGNOSIS — E039 Hypothyroidism, unspecified: Secondary | ICD-10-CM | POA: Diagnosis not present

## 2017-08-23 DIAGNOSIS — E782 Mixed hyperlipidemia: Secondary | ICD-10-CM | POA: Diagnosis not present

## 2017-09-19 DIAGNOSIS — R7303 Prediabetes: Secondary | ICD-10-CM | POA: Diagnosis not present

## 2017-09-19 DIAGNOSIS — E039 Hypothyroidism, unspecified: Secondary | ICD-10-CM | POA: Diagnosis not present

## 2017-09-19 DIAGNOSIS — E792 Myoadenylate deaminase deficiency: Secondary | ICD-10-CM | POA: Diagnosis not present

## 2017-09-26 DIAGNOSIS — K219 Gastro-esophageal reflux disease without esophagitis: Secondary | ICD-10-CM | POA: Diagnosis not present

## 2017-09-26 DIAGNOSIS — Z6831 Body mass index (BMI) 31.0-31.9, adult: Secondary | ICD-10-CM | POA: Diagnosis not present

## 2017-09-26 DIAGNOSIS — E782 Mixed hyperlipidemia: Secondary | ICD-10-CM | POA: Diagnosis not present

## 2017-09-26 DIAGNOSIS — F341 Dysthymic disorder: Secondary | ICD-10-CM | POA: Diagnosis not present

## 2017-09-26 DIAGNOSIS — E039 Hypothyroidism, unspecified: Secondary | ICD-10-CM | POA: Diagnosis not present

## 2017-09-26 DIAGNOSIS — R7303 Prediabetes: Secondary | ICD-10-CM | POA: Diagnosis not present

## 2017-09-26 DIAGNOSIS — L301 Dyshidrosis [pompholyx]: Secondary | ICD-10-CM | POA: Diagnosis not present

## 2017-09-26 DIAGNOSIS — Z0000 Encounter for general adult medical examination without abnormal findings: Secondary | ICD-10-CM | POA: Diagnosis not present

## 2017-10-09 DIAGNOSIS — L821 Other seborrheic keratosis: Secondary | ICD-10-CM | POA: Diagnosis not present

## 2017-10-09 DIAGNOSIS — L578 Other skin changes due to chronic exposure to nonionizing radiation: Secondary | ICD-10-CM | POA: Diagnosis not present

## 2017-10-09 DIAGNOSIS — B079 Viral wart, unspecified: Secondary | ICD-10-CM | POA: Diagnosis not present

## 2017-10-26 DIAGNOSIS — H401131 Primary open-angle glaucoma, bilateral, mild stage: Secondary | ICD-10-CM | POA: Diagnosis not present

## 2017-11-14 DIAGNOSIS — Z23 Encounter for immunization: Secondary | ICD-10-CM | POA: Diagnosis not present

## 2017-11-27 DIAGNOSIS — M8580 Other specified disorders of bone density and structure, unspecified site: Secondary | ICD-10-CM | POA: Diagnosis not present

## 2017-11-27 DIAGNOSIS — Z6830 Body mass index (BMI) 30.0-30.9, adult: Secondary | ICD-10-CM | POA: Diagnosis not present

## 2017-11-27 DIAGNOSIS — N644 Mastodynia: Secondary | ICD-10-CM | POA: Diagnosis not present

## 2017-11-27 DIAGNOSIS — F341 Dysthymic disorder: Secondary | ICD-10-CM | POA: Diagnosis not present

## 2017-12-01 DIAGNOSIS — M810 Age-related osteoporosis without current pathological fracture: Secondary | ICD-10-CM | POA: Diagnosis not present

## 2017-12-01 DIAGNOSIS — Z6831 Body mass index (BMI) 31.0-31.9, adult: Secondary | ICD-10-CM | POA: Diagnosis not present

## 2017-12-06 DIAGNOSIS — N6002 Solitary cyst of left breast: Secondary | ICD-10-CM | POA: Diagnosis not present

## 2017-12-06 DIAGNOSIS — N6012 Diffuse cystic mastopathy of left breast: Secondary | ICD-10-CM | POA: Diagnosis not present

## 2017-12-06 DIAGNOSIS — R928 Other abnormal and inconclusive findings on diagnostic imaging of breast: Secondary | ICD-10-CM | POA: Diagnosis not present

## 2018-01-04 DIAGNOSIS — M65342 Trigger finger, left ring finger: Secondary | ICD-10-CM | POA: Diagnosis not present

## 2018-01-04 DIAGNOSIS — M79642 Pain in left hand: Secondary | ICD-10-CM | POA: Diagnosis not present

## 2018-01-10 DIAGNOSIS — L821 Other seborrheic keratosis: Secondary | ICD-10-CM | POA: Diagnosis not present

## 2018-01-10 DIAGNOSIS — L578 Other skin changes due to chronic exposure to nonionizing radiation: Secondary | ICD-10-CM | POA: Diagnosis not present

## 2018-01-10 DIAGNOSIS — L820 Inflamed seborrheic keratosis: Secondary | ICD-10-CM | POA: Diagnosis not present

## 2018-01-15 DIAGNOSIS — Z1231 Encounter for screening mammogram for malignant neoplasm of breast: Secondary | ICD-10-CM | POA: Diagnosis not present

## 2018-01-22 DIAGNOSIS — R0789 Other chest pain: Secondary | ICD-10-CM | POA: Diagnosis not present

## 2018-01-22 DIAGNOSIS — Z6831 Body mass index (BMI) 31.0-31.9, adult: Secondary | ICD-10-CM | POA: Diagnosis not present

## 2018-01-22 DIAGNOSIS — S8000XA Contusion of unspecified knee, initial encounter: Secondary | ICD-10-CM | POA: Diagnosis not present

## 2018-01-30 DIAGNOSIS — R0789 Other chest pain: Secondary | ICD-10-CM | POA: Diagnosis not present

## 2018-01-30 DIAGNOSIS — M2560 Stiffness of unspecified joint, not elsewhere classified: Secondary | ICD-10-CM | POA: Diagnosis not present

## 2018-01-30 DIAGNOSIS — M6281 Muscle weakness (generalized): Secondary | ICD-10-CM | POA: Diagnosis not present

## 2018-01-30 DIAGNOSIS — Z96652 Presence of left artificial knee joint: Secondary | ICD-10-CM | POA: Diagnosis not present

## 2018-02-02 DIAGNOSIS — Z96652 Presence of left artificial knee joint: Secondary | ICD-10-CM | POA: Diagnosis not present

## 2018-02-02 DIAGNOSIS — Z471 Aftercare following joint replacement surgery: Secondary | ICD-10-CM | POA: Diagnosis not present

## 2018-02-02 DIAGNOSIS — M25561 Pain in right knee: Secondary | ICD-10-CM | POA: Diagnosis not present

## 2018-02-27 DIAGNOSIS — R293 Abnormal posture: Secondary | ICD-10-CM | POA: Diagnosis not present

## 2018-02-27 DIAGNOSIS — R2681 Unsteadiness on feet: Secondary | ICD-10-CM | POA: Diagnosis not present

## 2018-02-27 DIAGNOSIS — R0789 Other chest pain: Secondary | ICD-10-CM | POA: Diagnosis not present

## 2018-02-27 DIAGNOSIS — M25661 Stiffness of right knee, not elsewhere classified: Secondary | ICD-10-CM | POA: Diagnosis not present

## 2018-02-27 DIAGNOSIS — M2560 Stiffness of unspecified joint, not elsewhere classified: Secondary | ICD-10-CM | POA: Diagnosis not present

## 2018-02-27 DIAGNOSIS — Z96652 Presence of left artificial knee joint: Secondary | ICD-10-CM | POA: Diagnosis not present

## 2018-02-27 DIAGNOSIS — M25562 Pain in left knee: Secondary | ICD-10-CM | POA: Diagnosis not present

## 2018-02-27 DIAGNOSIS — M25462 Effusion, left knee: Secondary | ICD-10-CM | POA: Diagnosis not present

## 2018-02-27 DIAGNOSIS — M25662 Stiffness of left knee, not elsewhere classified: Secondary | ICD-10-CM | POA: Diagnosis not present

## 2018-02-27 DIAGNOSIS — M6281 Muscle weakness (generalized): Secondary | ICD-10-CM | POA: Diagnosis not present

## 2018-02-27 DIAGNOSIS — R2689 Other abnormalities of gait and mobility: Secondary | ICD-10-CM | POA: Diagnosis not present

## 2018-03-01 DIAGNOSIS — H401131 Primary open-angle glaucoma, bilateral, mild stage: Secondary | ICD-10-CM | POA: Diagnosis not present

## 2018-03-24 DIAGNOSIS — E039 Hypothyroidism, unspecified: Secondary | ICD-10-CM | POA: Diagnosis not present

## 2018-03-24 DIAGNOSIS — E559 Vitamin D deficiency, unspecified: Secondary | ICD-10-CM | POA: Diagnosis not present

## 2018-04-10 DIAGNOSIS — Z79899 Other long term (current) drug therapy: Secondary | ICD-10-CM | POA: Diagnosis not present

## 2018-04-10 DIAGNOSIS — E559 Vitamin D deficiency, unspecified: Secondary | ICD-10-CM | POA: Diagnosis not present

## 2018-04-10 DIAGNOSIS — E039 Hypothyroidism, unspecified: Secondary | ICD-10-CM | POA: Diagnosis not present

## 2018-04-10 DIAGNOSIS — E782 Mixed hyperlipidemia: Secondary | ICD-10-CM | POA: Diagnosis not present

## 2018-04-24 DIAGNOSIS — K219 Gastro-esophageal reflux disease without esophagitis: Secondary | ICD-10-CM | POA: Diagnosis not present

## 2018-04-24 DIAGNOSIS — E039 Hypothyroidism, unspecified: Secondary | ICD-10-CM | POA: Diagnosis not present

## 2018-05-07 DIAGNOSIS — Z125 Encounter for screening for malignant neoplasm of prostate: Secondary | ICD-10-CM | POA: Diagnosis not present

## 2018-05-07 DIAGNOSIS — E039 Hypothyroidism, unspecified: Secondary | ICD-10-CM | POA: Diagnosis not present

## 2018-05-07 DIAGNOSIS — Z03818 Encounter for observation for suspected exposure to other biological agents ruled out: Secondary | ICD-10-CM | POA: Diagnosis not present

## 2018-05-07 DIAGNOSIS — I2692 Saddle embolus of pulmonary artery without acute cor pulmonale: Secondary | ICD-10-CM | POA: Diagnosis not present

## 2018-05-07 DIAGNOSIS — Z0000 Encounter for general adult medical examination without abnormal findings: Secondary | ICD-10-CM | POA: Diagnosis not present

## 2018-05-07 DIAGNOSIS — I82401 Acute embolism and thrombosis of unspecified deep veins of right lower extremity: Secondary | ICD-10-CM | POA: Diagnosis not present

## 2018-05-07 DIAGNOSIS — E785 Hyperlipidemia, unspecified: Secondary | ICD-10-CM | POA: Diagnosis not present

## 2018-05-07 DIAGNOSIS — I82432 Acute embolism and thrombosis of left popliteal vein: Secondary | ICD-10-CM | POA: Diagnosis not present

## 2018-05-07 DIAGNOSIS — I10 Essential (primary) hypertension: Secondary | ICD-10-CM | POA: Diagnosis not present

## 2018-05-07 DIAGNOSIS — E782 Mixed hyperlipidemia: Secondary | ICD-10-CM | POA: Diagnosis not present

## 2018-06-01 DIAGNOSIS — N3000 Acute cystitis without hematuria: Secondary | ICD-10-CM | POA: Diagnosis not present

## 2018-06-01 DIAGNOSIS — Z6831 Body mass index (BMI) 31.0-31.9, adult: Secondary | ICD-10-CM | POA: Diagnosis not present

## 2018-06-01 DIAGNOSIS — F419 Anxiety disorder, unspecified: Secondary | ICD-10-CM | POA: Diagnosis not present

## 2018-07-02 DIAGNOSIS — H401131 Primary open-angle glaucoma, bilateral, mild stage: Secondary | ICD-10-CM | POA: Diagnosis not present

## 2018-07-24 DIAGNOSIS — E782 Mixed hyperlipidemia: Secondary | ICD-10-CM | POA: Diagnosis not present

## 2018-07-24 DIAGNOSIS — F419 Anxiety disorder, unspecified: Secondary | ICD-10-CM | POA: Diagnosis not present

## 2018-08-24 ENCOUNTER — Other Ambulatory Visit: Payer: Self-pay

## 2018-08-24 DIAGNOSIS — E039 Hypothyroidism, unspecified: Secondary | ICD-10-CM | POA: Diagnosis not present

## 2018-08-24 DIAGNOSIS — E782 Mixed hyperlipidemia: Secondary | ICD-10-CM | POA: Diagnosis not present

## 2018-09-20 DIAGNOSIS — E039 Hypothyroidism, unspecified: Secondary | ICD-10-CM | POA: Diagnosis not present

## 2018-09-20 DIAGNOSIS — E559 Vitamin D deficiency, unspecified: Secondary | ICD-10-CM | POA: Diagnosis not present

## 2018-09-20 DIAGNOSIS — E782 Mixed hyperlipidemia: Secondary | ICD-10-CM | POA: Diagnosis not present

## 2018-09-20 DIAGNOSIS — Z79899 Other long term (current) drug therapy: Secondary | ICD-10-CM | POA: Diagnosis not present

## 2018-09-28 DIAGNOSIS — Z0000 Encounter for general adult medical examination without abnormal findings: Secondary | ICD-10-CM | POA: Diagnosis not present

## 2018-09-28 DIAGNOSIS — Z6831 Body mass index (BMI) 31.0-31.9, adult: Secondary | ICD-10-CM | POA: Diagnosis not present

## 2018-09-28 DIAGNOSIS — E782 Mixed hyperlipidemia: Secondary | ICD-10-CM | POA: Diagnosis not present

## 2018-09-28 DIAGNOSIS — Z23 Encounter for immunization: Secondary | ICD-10-CM | POA: Diagnosis not present

## 2018-09-28 DIAGNOSIS — M810 Age-related osteoporosis without current pathological fracture: Secondary | ICD-10-CM | POA: Diagnosis not present

## 2018-09-28 DIAGNOSIS — S62514A Nondisplaced fracture of proximal phalanx of right thumb, initial encounter for closed fracture: Secondary | ICD-10-CM | POA: Diagnosis not present

## 2018-09-28 DIAGNOSIS — E039 Hypothyroidism, unspecified: Secondary | ICD-10-CM | POA: Diagnosis not present

## 2018-09-28 DIAGNOSIS — Z1331 Encounter for screening for depression: Secondary | ICD-10-CM | POA: Diagnosis not present

## 2018-09-28 DIAGNOSIS — K580 Irritable bowel syndrome with diarrhea: Secondary | ICD-10-CM | POA: Diagnosis not present

## 2018-10-10 DIAGNOSIS — M79644 Pain in right finger(s): Secondary | ICD-10-CM | POA: Diagnosis not present

## 2018-10-10 DIAGNOSIS — M65342 Trigger finger, left ring finger: Secondary | ICD-10-CM | POA: Diagnosis not present

## 2018-10-10 DIAGNOSIS — L255 Unspecified contact dermatitis due to plants, except food: Secondary | ICD-10-CM | POA: Diagnosis not present

## 2018-10-29 DIAGNOSIS — M79644 Pain in right finger(s): Secondary | ICD-10-CM | POA: Diagnosis not present

## 2018-10-29 DIAGNOSIS — M65342 Trigger finger, left ring finger: Secondary | ICD-10-CM | POA: Diagnosis not present

## 2018-10-29 DIAGNOSIS — L255 Unspecified contact dermatitis due to plants, except food: Secondary | ICD-10-CM | POA: Diagnosis not present

## 2018-10-30 DIAGNOSIS — L820 Inflamed seborrheic keratosis: Secondary | ICD-10-CM | POA: Diagnosis not present

## 2018-10-30 DIAGNOSIS — L821 Other seborrheic keratosis: Secondary | ICD-10-CM | POA: Diagnosis not present

## 2018-10-30 DIAGNOSIS — L578 Other skin changes due to chronic exposure to nonionizing radiation: Secondary | ICD-10-CM | POA: Diagnosis not present

## 2018-11-29 DIAGNOSIS — H401131 Primary open-angle glaucoma, bilateral, mild stage: Secondary | ICD-10-CM | POA: Diagnosis not present

## 2018-12-06 DIAGNOSIS — M65342 Trigger finger, left ring finger: Secondary | ICD-10-CM | POA: Diagnosis not present

## 2018-12-06 DIAGNOSIS — M79644 Pain in right finger(s): Secondary | ICD-10-CM | POA: Diagnosis not present

## 2018-12-29 DIAGNOSIS — Z1231 Encounter for screening mammogram for malignant neoplasm of breast: Secondary | ICD-10-CM | POA: Diagnosis not present

## 2018-12-31 DIAGNOSIS — M79644 Pain in right finger(s): Secondary | ICD-10-CM | POA: Diagnosis not present

## 2018-12-31 DIAGNOSIS — M79642 Pain in left hand: Secondary | ICD-10-CM | POA: Diagnosis not present

## 2018-12-31 DIAGNOSIS — M65342 Trigger finger, left ring finger: Secondary | ICD-10-CM | POA: Diagnosis not present

## 2019-02-20 DIAGNOSIS — H524 Presbyopia: Secondary | ICD-10-CM | POA: Diagnosis not present

## 2019-04-24 DIAGNOSIS — M25531 Pain in right wrist: Secondary | ICD-10-CM | POA: Diagnosis not present

## 2019-04-24 DIAGNOSIS — M79644 Pain in right finger(s): Secondary | ICD-10-CM | POA: Diagnosis not present

## 2019-04-24 DIAGNOSIS — S52501A Unspecified fracture of the lower end of right radius, initial encounter for closed fracture: Secondary | ICD-10-CM | POA: Diagnosis not present

## 2019-05-13 DIAGNOSIS — M25531 Pain in right wrist: Secondary | ICD-10-CM | POA: Diagnosis not present

## 2019-05-13 DIAGNOSIS — S52501A Unspecified fracture of the lower end of right radius, initial encounter for closed fracture: Secondary | ICD-10-CM | POA: Diagnosis not present

## 2019-05-23 DIAGNOSIS — H401131 Primary open-angle glaucoma, bilateral, mild stage: Secondary | ICD-10-CM | POA: Diagnosis not present

## 2019-06-03 DIAGNOSIS — S52501D Unspecified fracture of the lower end of right radius, subsequent encounter for closed fracture with routine healing: Secondary | ICD-10-CM | POA: Diagnosis not present

## 2019-06-03 DIAGNOSIS — M25531 Pain in right wrist: Secondary | ICD-10-CM | POA: Diagnosis not present

## 2019-06-10 DIAGNOSIS — M25631 Stiffness of right wrist, not elsewhere classified: Secondary | ICD-10-CM | POA: Diagnosis not present

## 2019-06-20 DIAGNOSIS — M25631 Stiffness of right wrist, not elsewhere classified: Secondary | ICD-10-CM | POA: Diagnosis not present

## 2019-06-27 DIAGNOSIS — N3001 Acute cystitis with hematuria: Secondary | ICD-10-CM | POA: Diagnosis not present

## 2019-06-27 DIAGNOSIS — Z6833 Body mass index (BMI) 33.0-33.9, adult: Secondary | ICD-10-CM | POA: Diagnosis not present

## 2019-06-28 DIAGNOSIS — N3001 Acute cystitis with hematuria: Secondary | ICD-10-CM | POA: Diagnosis not present

## 2019-07-03 DIAGNOSIS — M25631 Stiffness of right wrist, not elsewhere classified: Secondary | ICD-10-CM | POA: Diagnosis not present

## 2019-07-08 DIAGNOSIS — S52501D Unspecified fracture of the lower end of right radius, subsequent encounter for closed fracture with routine healing: Secondary | ICD-10-CM | POA: Diagnosis not present

## 2019-08-22 ENCOUNTER — Other Ambulatory Visit: Payer: Self-pay

## 2019-09-23 DIAGNOSIS — E039 Hypothyroidism, unspecified: Secondary | ICD-10-CM | POA: Diagnosis not present

## 2019-09-23 DIAGNOSIS — Z6833 Body mass index (BMI) 33.0-33.9, adult: Secondary | ICD-10-CM | POA: Diagnosis not present

## 2019-09-23 DIAGNOSIS — E782 Mixed hyperlipidemia: Secondary | ICD-10-CM | POA: Diagnosis not present

## 2019-09-23 DIAGNOSIS — R300 Dysuria: Secondary | ICD-10-CM | POA: Diagnosis not present

## 2019-09-23 DIAGNOSIS — Z79899 Other long term (current) drug therapy: Secondary | ICD-10-CM | POA: Diagnosis not present

## 2019-09-23 DIAGNOSIS — E559 Vitamin D deficiency, unspecified: Secondary | ICD-10-CM | POA: Diagnosis not present

## 2019-10-14 DIAGNOSIS — M810 Age-related osteoporosis without current pathological fracture: Secondary | ICD-10-CM | POA: Diagnosis not present

## 2019-10-14 DIAGNOSIS — K219 Gastro-esophageal reflux disease without esophagitis: Secondary | ICD-10-CM | POA: Diagnosis not present

## 2019-10-14 DIAGNOSIS — G4730 Sleep apnea, unspecified: Secondary | ICD-10-CM | POA: Diagnosis not present

## 2019-10-14 DIAGNOSIS — E039 Hypothyroidism, unspecified: Secondary | ICD-10-CM | POA: Diagnosis not present

## 2019-10-14 DIAGNOSIS — F341 Dysthymic disorder: Secondary | ICD-10-CM | POA: Diagnosis not present

## 2019-10-14 DIAGNOSIS — Z6833 Body mass index (BMI) 33.0-33.9, adult: Secondary | ICD-10-CM | POA: Diagnosis not present

## 2019-10-14 DIAGNOSIS — E782 Mixed hyperlipidemia: Secondary | ICD-10-CM | POA: Diagnosis not present

## 2019-10-14 DIAGNOSIS — Z0000 Encounter for general adult medical examination without abnormal findings: Secondary | ICD-10-CM | POA: Diagnosis not present

## 2019-11-04 DIAGNOSIS — L570 Actinic keratosis: Secondary | ICD-10-CM | POA: Diagnosis not present

## 2019-11-04 DIAGNOSIS — D225 Melanocytic nevi of trunk: Secondary | ICD-10-CM | POA: Diagnosis not present

## 2019-11-04 DIAGNOSIS — L304 Erythema intertrigo: Secondary | ICD-10-CM | POA: Diagnosis not present

## 2019-11-06 DIAGNOSIS — J209 Acute bronchitis, unspecified: Secondary | ICD-10-CM | POA: Diagnosis not present

## 2019-11-28 DIAGNOSIS — H401131 Primary open-angle glaucoma, bilateral, mild stage: Secondary | ICD-10-CM | POA: Diagnosis not present

## 2019-11-29 DIAGNOSIS — Z780 Asymptomatic menopausal state: Secondary | ICD-10-CM | POA: Diagnosis not present

## 2019-12-30 DIAGNOSIS — Z1231 Encounter for screening mammogram for malignant neoplasm of breast: Secondary | ICD-10-CM | POA: Diagnosis not present

## 2019-12-31 DIAGNOSIS — Z23 Encounter for immunization: Secondary | ICD-10-CM | POA: Diagnosis not present

## 2020-04-10 DIAGNOSIS — N3946 Mixed incontinence: Secondary | ICD-10-CM | POA: Diagnosis not present

## 2020-05-25 DIAGNOSIS — J209 Acute bronchitis, unspecified: Secondary | ICD-10-CM | POA: Diagnosis not present

## 2020-05-25 DIAGNOSIS — Z131 Encounter for screening for diabetes mellitus: Secondary | ICD-10-CM | POA: Diagnosis not present

## 2020-05-25 DIAGNOSIS — Z6834 Body mass index (BMI) 34.0-34.9, adult: Secondary | ICD-10-CM | POA: Diagnosis not present

## 2020-05-28 DIAGNOSIS — H401131 Primary open-angle glaucoma, bilateral, mild stage: Secondary | ICD-10-CM | POA: Diagnosis not present

## 2020-06-02 DIAGNOSIS — J329 Chronic sinusitis, unspecified: Secondary | ICD-10-CM | POA: Diagnosis not present

## 2020-06-02 DIAGNOSIS — J40 Bronchitis, not specified as acute or chronic: Secondary | ICD-10-CM | POA: Diagnosis not present

## 2020-06-02 DIAGNOSIS — K589 Irritable bowel syndrome without diarrhea: Secondary | ICD-10-CM | POA: Diagnosis not present

## 2020-06-02 DIAGNOSIS — Z6833 Body mass index (BMI) 33.0-33.9, adult: Secondary | ICD-10-CM | POA: Diagnosis not present

## 2020-06-02 DIAGNOSIS — N3946 Mixed incontinence: Secondary | ICD-10-CM | POA: Diagnosis not present

## 2020-08-31 DIAGNOSIS — K219 Gastro-esophageal reflux disease without esophagitis: Secondary | ICD-10-CM | POA: Diagnosis not present

## 2020-08-31 DIAGNOSIS — R195 Other fecal abnormalities: Secondary | ICD-10-CM | POA: Diagnosis not present

## 2020-08-31 DIAGNOSIS — R1310 Dysphagia, unspecified: Secondary | ICD-10-CM | POA: Diagnosis not present

## 2020-08-31 DIAGNOSIS — K5790 Diverticulosis of intestine, part unspecified, without perforation or abscess without bleeding: Secondary | ICD-10-CM | POA: Diagnosis not present

## 2020-09-04 DIAGNOSIS — K317 Polyp of stomach and duodenum: Secondary | ICD-10-CM | POA: Diagnosis not present

## 2020-09-04 DIAGNOSIS — K222 Esophageal obstruction: Secondary | ICD-10-CM | POA: Diagnosis not present

## 2020-09-04 DIAGNOSIS — Z86010 Personal history of colonic polyps: Secondary | ICD-10-CM | POA: Diagnosis not present

## 2020-09-04 DIAGNOSIS — R1310 Dysphagia, unspecified: Secondary | ICD-10-CM | POA: Diagnosis not present

## 2020-09-04 DIAGNOSIS — K219 Gastro-esophageal reflux disease without esophagitis: Secondary | ICD-10-CM | POA: Diagnosis not present

## 2020-09-04 DIAGNOSIS — K449 Diaphragmatic hernia without obstruction or gangrene: Secondary | ICD-10-CM | POA: Diagnosis not present

## 2020-09-04 DIAGNOSIS — K5730 Diverticulosis of large intestine without perforation or abscess without bleeding: Secondary | ICD-10-CM | POA: Diagnosis not present

## 2020-09-04 DIAGNOSIS — K2210 Ulcer of esophagus without bleeding: Secondary | ICD-10-CM | POA: Diagnosis not present

## 2020-10-14 DIAGNOSIS — Z6833 Body mass index (BMI) 33.0-33.9, adult: Secondary | ICD-10-CM | POA: Diagnosis not present

## 2020-10-14 DIAGNOSIS — E559 Vitamin D deficiency, unspecified: Secondary | ICD-10-CM | POA: Diagnosis not present

## 2020-10-14 DIAGNOSIS — K589 Irritable bowel syndrome without diarrhea: Secondary | ICD-10-CM | POA: Diagnosis not present

## 2020-10-14 DIAGNOSIS — Z23 Encounter for immunization: Secondary | ICD-10-CM | POA: Diagnosis not present

## 2020-10-14 DIAGNOSIS — R1310 Dysphagia, unspecified: Secondary | ICD-10-CM | POA: Diagnosis not present

## 2020-10-14 DIAGNOSIS — E782 Mixed hyperlipidemia: Secondary | ICD-10-CM | POA: Diagnosis not present

## 2020-10-14 DIAGNOSIS — M810 Age-related osteoporosis without current pathological fracture: Secondary | ICD-10-CM | POA: Diagnosis not present

## 2020-10-14 DIAGNOSIS — K219 Gastro-esophageal reflux disease without esophagitis: Secondary | ICD-10-CM | POA: Diagnosis not present

## 2020-10-14 DIAGNOSIS — E8881 Metabolic syndrome: Secondary | ICD-10-CM | POA: Diagnosis not present

## 2020-10-14 DIAGNOSIS — Z0000 Encounter for general adult medical examination without abnormal findings: Secondary | ICD-10-CM | POA: Diagnosis not present

## 2020-10-14 DIAGNOSIS — E039 Hypothyroidism, unspecified: Secondary | ICD-10-CM | POA: Diagnosis not present

## 2020-10-14 DIAGNOSIS — Z79899 Other long term (current) drug therapy: Secondary | ICD-10-CM | POA: Diagnosis not present

## 2020-11-02 DIAGNOSIS — M25561 Pain in right knee: Secondary | ICD-10-CM | POA: Diagnosis not present

## 2020-11-02 DIAGNOSIS — M7651 Patellar tendinitis, right knee: Secondary | ICD-10-CM | POA: Diagnosis not present

## 2020-11-02 DIAGNOSIS — M25511 Pain in right shoulder: Secondary | ICD-10-CM | POA: Diagnosis not present

## 2020-11-02 DIAGNOSIS — S46911A Strain of unspecified muscle, fascia and tendon at shoulder and upper arm level, right arm, initial encounter: Secondary | ICD-10-CM | POA: Diagnosis not present

## 2020-11-09 DIAGNOSIS — L821 Other seborrheic keratosis: Secondary | ICD-10-CM | POA: Diagnosis not present

## 2020-11-09 DIAGNOSIS — D225 Melanocytic nevi of trunk: Secondary | ICD-10-CM | POA: Diagnosis not present

## 2020-11-09 DIAGNOSIS — L578 Other skin changes due to chronic exposure to nonionizing radiation: Secondary | ICD-10-CM | POA: Diagnosis not present

## 2020-11-09 DIAGNOSIS — L820 Inflamed seborrheic keratosis: Secondary | ICD-10-CM | POA: Diagnosis not present

## 2020-12-09 DIAGNOSIS — H401131 Primary open-angle glaucoma, bilateral, mild stage: Secondary | ICD-10-CM | POA: Diagnosis not present

## 2020-12-14 DIAGNOSIS — M25511 Pain in right shoulder: Secondary | ICD-10-CM | POA: Diagnosis not present

## 2020-12-14 DIAGNOSIS — M25561 Pain in right knee: Secondary | ICD-10-CM | POA: Diagnosis not present

## 2020-12-23 DIAGNOSIS — R300 Dysuria: Secondary | ICD-10-CM | POA: Diagnosis not present

## 2021-01-21 DIAGNOSIS — Z1231 Encounter for screening mammogram for malignant neoplasm of breast: Secondary | ICD-10-CM | POA: Diagnosis not present

## 2021-02-22 DIAGNOSIS — E785 Hyperlipidemia, unspecified: Secondary | ICD-10-CM | POA: Diagnosis not present

## 2021-02-22 DIAGNOSIS — R0789 Other chest pain: Secondary | ICD-10-CM | POA: Diagnosis not present

## 2021-02-22 DIAGNOSIS — R6889 Other general symptoms and signs: Secondary | ICD-10-CM | POA: Diagnosis not present

## 2021-02-22 DIAGNOSIS — M79605 Pain in left leg: Secondary | ICD-10-CM | POA: Diagnosis not present

## 2021-02-22 DIAGNOSIS — E038 Other specified hypothyroidism: Secondary | ICD-10-CM | POA: Diagnosis not present

## 2021-02-26 DIAGNOSIS — R6889 Other general symptoms and signs: Secondary | ICD-10-CM | POA: Diagnosis not present

## 2021-02-26 DIAGNOSIS — M79605 Pain in left leg: Secondary | ICD-10-CM | POA: Diagnosis not present

## 2021-03-08 DIAGNOSIS — Z20828 Contact with and (suspected) exposure to other viral communicable diseases: Secondary | ICD-10-CM | POA: Diagnosis not present

## 2021-03-08 DIAGNOSIS — J329 Chronic sinusitis, unspecified: Secondary | ICD-10-CM | POA: Diagnosis not present

## 2021-03-08 DIAGNOSIS — J40 Bronchitis, not specified as acute or chronic: Secondary | ICD-10-CM | POA: Diagnosis not present

## 2021-03-08 DIAGNOSIS — Z6832 Body mass index (BMI) 32.0-32.9, adult: Secondary | ICD-10-CM | POA: Diagnosis not present

## 2021-03-15 DIAGNOSIS — R0789 Other chest pain: Secondary | ICD-10-CM | POA: Diagnosis not present

## 2021-04-08 DIAGNOSIS — H353131 Nonexudative age-related macular degeneration, bilateral, early dry stage: Secondary | ICD-10-CM | POA: Diagnosis not present

## 2021-04-08 DIAGNOSIS — H401131 Primary open-angle glaucoma, bilateral, mild stage: Secondary | ICD-10-CM | POA: Diagnosis not present

## 2021-04-14 DIAGNOSIS — R0789 Other chest pain: Secondary | ICD-10-CM | POA: Diagnosis not present

## 2021-04-14 DIAGNOSIS — E785 Hyperlipidemia, unspecified: Secondary | ICD-10-CM | POA: Diagnosis not present

## 2021-04-14 DIAGNOSIS — R0609 Other forms of dyspnea: Secondary | ICD-10-CM | POA: Diagnosis not present

## 2021-04-14 DIAGNOSIS — I781 Nevus, non-neoplastic: Secondary | ICD-10-CM | POA: Diagnosis not present

## 2021-04-14 DIAGNOSIS — M79605 Pain in left leg: Secondary | ICD-10-CM | POA: Diagnosis not present

## 2021-05-26 DIAGNOSIS — M25561 Pain in right knee: Secondary | ICD-10-CM | POA: Diagnosis not present

## 2021-06-03 DIAGNOSIS — M2560 Stiffness of unspecified joint, not elsewhere classified: Secondary | ICD-10-CM | POA: Diagnosis not present

## 2021-06-03 DIAGNOSIS — M25661 Stiffness of right knee, not elsewhere classified: Secondary | ICD-10-CM | POA: Diagnosis not present

## 2021-06-03 DIAGNOSIS — M6281 Muscle weakness (generalized): Secondary | ICD-10-CM | POA: Diagnosis not present

## 2021-06-03 DIAGNOSIS — M25662 Stiffness of left knee, not elsewhere classified: Secondary | ICD-10-CM | POA: Diagnosis not present

## 2021-06-03 DIAGNOSIS — R2681 Unsteadiness on feet: Secondary | ICD-10-CM | POA: Diagnosis not present

## 2021-06-03 DIAGNOSIS — M25561 Pain in right knee: Secondary | ICD-10-CM | POA: Diagnosis not present

## 2021-06-03 DIAGNOSIS — R293 Abnormal posture: Secondary | ICD-10-CM | POA: Diagnosis not present

## 2021-06-29 DIAGNOSIS — M25661 Stiffness of right knee, not elsewhere classified: Secondary | ICD-10-CM | POA: Diagnosis not present

## 2021-06-29 DIAGNOSIS — M25561 Pain in right knee: Secondary | ICD-10-CM | POA: Diagnosis not present

## 2021-06-29 DIAGNOSIS — R293 Abnormal posture: Secondary | ICD-10-CM | POA: Diagnosis not present

## 2021-06-29 DIAGNOSIS — M25662 Stiffness of left knee, not elsewhere classified: Secondary | ICD-10-CM | POA: Diagnosis not present

## 2021-06-29 DIAGNOSIS — R2681 Unsteadiness on feet: Secondary | ICD-10-CM | POA: Diagnosis not present

## 2021-06-29 DIAGNOSIS — M6281 Muscle weakness (generalized): Secondary | ICD-10-CM | POA: Diagnosis not present

## 2021-06-29 DIAGNOSIS — M2560 Stiffness of unspecified joint, not elsewhere classified: Secondary | ICD-10-CM | POA: Diagnosis not present

## 2021-07-07 DIAGNOSIS — M25561 Pain in right knee: Secondary | ICD-10-CM | POA: Diagnosis not present

## 2021-08-12 DIAGNOSIS — H401131 Primary open-angle glaucoma, bilateral, mild stage: Secondary | ICD-10-CM | POA: Diagnosis not present

## 2021-09-16 DIAGNOSIS — E039 Hypothyroidism, unspecified: Secondary | ICD-10-CM | POA: Diagnosis not present

## 2021-09-16 DIAGNOSIS — H3530 Unspecified macular degeneration: Secondary | ICD-10-CM | POA: Diagnosis not present

## 2021-09-16 DIAGNOSIS — G8929 Other chronic pain: Secondary | ICD-10-CM | POA: Diagnosis not present

## 2021-09-16 DIAGNOSIS — K5900 Constipation, unspecified: Secondary | ICD-10-CM | POA: Diagnosis not present

## 2021-09-16 DIAGNOSIS — E669 Obesity, unspecified: Secondary | ICD-10-CM | POA: Diagnosis not present

## 2021-09-16 DIAGNOSIS — E785 Hyperlipidemia, unspecified: Secondary | ICD-10-CM | POA: Diagnosis not present

## 2021-09-16 DIAGNOSIS — F419 Anxiety disorder, unspecified: Secondary | ICD-10-CM | POA: Diagnosis not present

## 2021-09-16 DIAGNOSIS — K589 Irritable bowel syndrome without diarrhea: Secondary | ICD-10-CM | POA: Diagnosis not present

## 2021-09-16 DIAGNOSIS — G589 Mononeuropathy, unspecified: Secondary | ICD-10-CM | POA: Diagnosis not present

## 2021-09-16 DIAGNOSIS — F3342 Major depressive disorder, recurrent, in full remission: Secondary | ICD-10-CM | POA: Diagnosis not present

## 2021-09-16 DIAGNOSIS — K219 Gastro-esophageal reflux disease without esophagitis: Secondary | ICD-10-CM | POA: Diagnosis not present

## 2021-09-16 DIAGNOSIS — H409 Unspecified glaucoma: Secondary | ICD-10-CM | POA: Diagnosis not present

## 2021-10-14 LAB — POCT GLYCOSYLATED HEMOGLOBIN (HGB A1C): Hemoglobin-A1c: 5.8

## 2021-10-14 LAB — GLUCOSE, POCT (MANUAL RESULT ENTRY): POC Glucose: 108 mg/dl — AB (ref 70–99)

## 2021-10-14 NOTE — Progress Notes (Signed)
Patient here today for BP screening, Bp was elevated today. Present with no concerns. She states she has a visit scheduled with PCP on 10.11.23

## 2021-10-25 DIAGNOSIS — M79671 Pain in right foot: Secondary | ICD-10-CM | POA: Diagnosis not present

## 2021-11-03 DIAGNOSIS — Z0000 Encounter for general adult medical examination without abnormal findings: Secondary | ICD-10-CM | POA: Diagnosis not present

## 2021-11-03 DIAGNOSIS — E039 Hypothyroidism, unspecified: Secondary | ICD-10-CM | POA: Diagnosis not present

## 2021-11-03 DIAGNOSIS — E782 Mixed hyperlipidemia: Secondary | ICD-10-CM | POA: Diagnosis not present

## 2021-11-03 DIAGNOSIS — E559 Vitamin D deficiency, unspecified: Secondary | ICD-10-CM | POA: Diagnosis not present

## 2021-11-03 DIAGNOSIS — Z79899 Other long term (current) drug therapy: Secondary | ICD-10-CM | POA: Diagnosis not present

## 2021-11-08 DIAGNOSIS — E559 Vitamin D deficiency, unspecified: Secondary | ICD-10-CM | POA: Diagnosis not present

## 2021-11-08 DIAGNOSIS — Z6832 Body mass index (BMI) 32.0-32.9, adult: Secondary | ICD-10-CM | POA: Diagnosis not present

## 2021-11-08 DIAGNOSIS — K589 Irritable bowel syndrome without diarrhea: Secondary | ICD-10-CM | POA: Diagnosis not present

## 2021-11-08 DIAGNOSIS — E039 Hypothyroidism, unspecified: Secondary | ICD-10-CM | POA: Diagnosis not present

## 2021-11-08 DIAGNOSIS — E782 Mixed hyperlipidemia: Secondary | ICD-10-CM | POA: Diagnosis not present

## 2021-11-08 DIAGNOSIS — Z0000 Encounter for general adult medical examination without abnormal findings: Secondary | ICD-10-CM | POA: Diagnosis not present

## 2021-11-08 DIAGNOSIS — F419 Anxiety disorder, unspecified: Secondary | ICD-10-CM | POA: Diagnosis not present

## 2021-11-08 DIAGNOSIS — M810 Age-related osteoporosis without current pathological fracture: Secondary | ICD-10-CM | POA: Diagnosis not present

## 2021-11-08 DIAGNOSIS — F341 Dysthymic disorder: Secondary | ICD-10-CM | POA: Diagnosis not present

## 2021-11-11 DIAGNOSIS — L821 Other seborrheic keratosis: Secondary | ICD-10-CM | POA: Diagnosis not present

## 2021-11-11 DIAGNOSIS — L578 Other skin changes due to chronic exposure to nonionizing radiation: Secondary | ICD-10-CM | POA: Diagnosis not present

## 2021-11-17 DIAGNOSIS — M79671 Pain in right foot: Secondary | ICD-10-CM | POA: Diagnosis not present

## 2021-11-17 DIAGNOSIS — S92354A Nondisplaced fracture of fifth metatarsal bone, right foot, initial encounter for closed fracture: Secondary | ICD-10-CM | POA: Diagnosis not present

## 2021-12-13 DIAGNOSIS — H401131 Primary open-angle glaucoma, bilateral, mild stage: Secondary | ICD-10-CM | POA: Diagnosis not present

## 2021-12-20 DIAGNOSIS — S92354D Nondisplaced fracture of fifth metatarsal bone, right foot, subsequent encounter for fracture with routine healing: Secondary | ICD-10-CM | POA: Diagnosis not present

## 2021-12-20 DIAGNOSIS — S92354A Nondisplaced fracture of fifth metatarsal bone, right foot, initial encounter for closed fracture: Secondary | ICD-10-CM | POA: Diagnosis not present

## 2022-01-08 DIAGNOSIS — S20212A Contusion of left front wall of thorax, initial encounter: Secondary | ICD-10-CM | POA: Diagnosis not present

## 2022-01-08 DIAGNOSIS — S62002A Unspecified fracture of navicular [scaphoid] bone of left wrist, initial encounter for closed fracture: Secondary | ICD-10-CM | POA: Diagnosis not present

## 2022-01-08 DIAGNOSIS — S52572A Other intraarticular fracture of lower end of left radius, initial encounter for closed fracture: Secondary | ICD-10-CM | POA: Diagnosis not present

## 2022-01-08 DIAGNOSIS — S62102A Fracture of unspecified carpal bone, left wrist, initial encounter for closed fracture: Secondary | ICD-10-CM | POA: Diagnosis not present

## 2022-01-08 DIAGNOSIS — Z043 Encounter for examination and observation following other accident: Secondary | ICD-10-CM | POA: Diagnosis not present

## 2022-01-08 DIAGNOSIS — W19XXXA Unspecified fall, initial encounter: Secondary | ICD-10-CM | POA: Diagnosis not present

## 2022-01-10 DIAGNOSIS — M25531 Pain in right wrist: Secondary | ICD-10-CM | POA: Diagnosis not present

## 2022-01-10 DIAGNOSIS — M25532 Pain in left wrist: Secondary | ICD-10-CM | POA: Diagnosis not present

## 2022-01-10 DIAGNOSIS — S52502A Unspecified fracture of the lower end of left radius, initial encounter for closed fracture: Secondary | ICD-10-CM | POA: Diagnosis not present

## 2022-01-19 DIAGNOSIS — S92354D Nondisplaced fracture of fifth metatarsal bone, right foot, subsequent encounter for fracture with routine healing: Secondary | ICD-10-CM | POA: Diagnosis not present

## 2022-01-20 DIAGNOSIS — M25531 Pain in right wrist: Secondary | ICD-10-CM | POA: Diagnosis not present

## 2022-01-20 DIAGNOSIS — M25532 Pain in left wrist: Secondary | ICD-10-CM | POA: Diagnosis not present

## 2022-01-20 DIAGNOSIS — S52502A Unspecified fracture of the lower end of left radius, initial encounter for closed fracture: Secondary | ICD-10-CM | POA: Diagnosis not present

## 2022-01-25 DIAGNOSIS — Z1231 Encounter for screening mammogram for malignant neoplasm of breast: Secondary | ICD-10-CM | POA: Diagnosis not present

## 2022-02-07 DIAGNOSIS — H00014 Hordeolum externum left upper eyelid: Secondary | ICD-10-CM | POA: Diagnosis not present

## 2022-02-07 DIAGNOSIS — M25532 Pain in left wrist: Secondary | ICD-10-CM | POA: Diagnosis not present

## 2022-02-07 DIAGNOSIS — M25531 Pain in right wrist: Secondary | ICD-10-CM | POA: Diagnosis not present

## 2022-02-07 DIAGNOSIS — S52502A Unspecified fracture of the lower end of left radius, initial encounter for closed fracture: Secondary | ICD-10-CM | POA: Diagnosis not present

## 2022-02-23 DIAGNOSIS — Z6833 Body mass index (BMI) 33.0-33.9, adult: Secondary | ICD-10-CM | POA: Diagnosis not present

## 2022-02-23 DIAGNOSIS — K219 Gastro-esophageal reflux disease without esophagitis: Secondary | ICD-10-CM | POA: Diagnosis not present

## 2022-02-23 DIAGNOSIS — M545 Low back pain, unspecified: Secondary | ICD-10-CM | POA: Diagnosis not present

## 2022-02-23 DIAGNOSIS — N3946 Mixed incontinence: Secondary | ICD-10-CM | POA: Diagnosis not present

## 2022-02-23 DIAGNOSIS — Z79899 Other long term (current) drug therapy: Secondary | ICD-10-CM | POA: Diagnosis not present

## 2022-02-23 DIAGNOSIS — E88819 Insulin resistance, unspecified: Secondary | ICD-10-CM | POA: Diagnosis not present

## 2022-02-28 DIAGNOSIS — M25532 Pain in left wrist: Secondary | ICD-10-CM | POA: Diagnosis not present

## 2022-02-28 DIAGNOSIS — S52502A Unspecified fracture of the lower end of left radius, initial encounter for closed fracture: Secondary | ICD-10-CM | POA: Diagnosis not present

## 2022-02-28 DIAGNOSIS — H00014 Hordeolum externum left upper eyelid: Secondary | ICD-10-CM | POA: Diagnosis not present

## 2022-04-11 DIAGNOSIS — S52502D Unspecified fracture of the lower end of left radius, subsequent encounter for closed fracture with routine healing: Secondary | ICD-10-CM | POA: Diagnosis not present

## 2022-04-14 DIAGNOSIS — H353131 Nonexudative age-related macular degeneration, bilateral, early dry stage: Secondary | ICD-10-CM | POA: Diagnosis not present

## 2022-04-14 DIAGNOSIS — H401131 Primary open-angle glaucoma, bilateral, mild stage: Secondary | ICD-10-CM | POA: Diagnosis not present

## 2022-05-31 DIAGNOSIS — M810 Age-related osteoporosis without current pathological fracture: Secondary | ICD-10-CM | POA: Diagnosis not present

## 2022-05-31 DIAGNOSIS — E88819 Insulin resistance, unspecified: Secondary | ICD-10-CM | POA: Diagnosis not present

## 2022-05-31 DIAGNOSIS — Z6833 Body mass index (BMI) 33.0-33.9, adult: Secondary | ICD-10-CM | POA: Diagnosis not present

## 2022-05-31 DIAGNOSIS — Z1339 Encounter for screening examination for other mental health and behavioral disorders: Secondary | ICD-10-CM | POA: Diagnosis not present

## 2022-06-07 DIAGNOSIS — L578 Other skin changes due to chronic exposure to nonionizing radiation: Secondary | ICD-10-CM | POA: Diagnosis not present

## 2022-06-07 DIAGNOSIS — L821 Other seborrheic keratosis: Secondary | ICD-10-CM | POA: Diagnosis not present

## 2022-06-07 DIAGNOSIS — L300 Nummular dermatitis: Secondary | ICD-10-CM | POA: Diagnosis not present

## 2022-06-07 DIAGNOSIS — L820 Inflamed seborrheic keratosis: Secondary | ICD-10-CM | POA: Diagnosis not present

## 2022-07-11 DIAGNOSIS — H109 Unspecified conjunctivitis: Secondary | ICD-10-CM | POA: Diagnosis not present

## 2022-08-17 DIAGNOSIS — H401131 Primary open-angle glaucoma, bilateral, mild stage: Secondary | ICD-10-CM | POA: Diagnosis not present

## 2022-09-20 DIAGNOSIS — K589 Irritable bowel syndrome without diarrhea: Secondary | ICD-10-CM | POA: Diagnosis not present

## 2022-09-20 DIAGNOSIS — F419 Anxiety disorder, unspecified: Secondary | ICD-10-CM | POA: Diagnosis not present

## 2022-09-20 DIAGNOSIS — H409 Unspecified glaucoma: Secondary | ICD-10-CM | POA: Diagnosis not present

## 2022-09-20 DIAGNOSIS — M1990 Unspecified osteoarthritis, unspecified site: Secondary | ICD-10-CM | POA: Diagnosis not present

## 2022-09-20 DIAGNOSIS — M810 Age-related osteoporosis without current pathological fracture: Secondary | ICD-10-CM | POA: Diagnosis not present

## 2022-09-20 DIAGNOSIS — F3341 Major depressive disorder, recurrent, in partial remission: Secondary | ICD-10-CM | POA: Diagnosis not present

## 2022-09-20 DIAGNOSIS — G8929 Other chronic pain: Secondary | ICD-10-CM | POA: Diagnosis not present

## 2022-09-20 DIAGNOSIS — E785 Hyperlipidemia, unspecified: Secondary | ICD-10-CM | POA: Diagnosis not present

## 2022-09-20 DIAGNOSIS — E039 Hypothyroidism, unspecified: Secondary | ICD-10-CM | POA: Diagnosis not present

## 2022-09-20 DIAGNOSIS — K219 Gastro-esophageal reflux disease without esophagitis: Secondary | ICD-10-CM | POA: Diagnosis not present

## 2022-09-20 DIAGNOSIS — E669 Obesity, unspecified: Secondary | ICD-10-CM | POA: Diagnosis not present

## 2022-09-20 DIAGNOSIS — H3530 Unspecified macular degeneration: Secondary | ICD-10-CM | POA: Diagnosis not present

## 2022-09-22 DIAGNOSIS — Z131 Encounter for screening for diabetes mellitus: Secondary | ICD-10-CM | POA: Diagnosis not present

## 2022-11-10 DIAGNOSIS — E039 Hypothyroidism, unspecified: Secondary | ICD-10-CM | POA: Diagnosis not present

## 2022-11-10 DIAGNOSIS — E782 Mixed hyperlipidemia: Secondary | ICD-10-CM | POA: Diagnosis not present

## 2022-11-10 DIAGNOSIS — Z79899 Other long term (current) drug therapy: Secondary | ICD-10-CM | POA: Diagnosis not present

## 2022-11-17 DIAGNOSIS — Z6832 Body mass index (BMI) 32.0-32.9, adult: Secondary | ICD-10-CM | POA: Diagnosis not present

## 2022-11-17 DIAGNOSIS — Z0000 Encounter for general adult medical examination without abnormal findings: Secondary | ICD-10-CM | POA: Diagnosis not present

## 2022-11-17 DIAGNOSIS — F341 Dysthymic disorder: Secondary | ICD-10-CM | POA: Diagnosis not present

## 2022-11-17 DIAGNOSIS — M810 Age-related osteoporosis without current pathological fracture: Secondary | ICD-10-CM | POA: Diagnosis not present

## 2022-11-17 DIAGNOSIS — N3946 Mixed incontinence: Secondary | ICD-10-CM | POA: Diagnosis not present

## 2022-11-17 DIAGNOSIS — E039 Hypothyroidism, unspecified: Secondary | ICD-10-CM | POA: Diagnosis not present

## 2022-11-17 DIAGNOSIS — Z1331 Encounter for screening for depression: Secondary | ICD-10-CM | POA: Diagnosis not present

## 2022-11-17 DIAGNOSIS — Z1339 Encounter for screening examination for other mental health and behavioral disorders: Secondary | ICD-10-CM | POA: Diagnosis not present

## 2022-11-17 DIAGNOSIS — S51819A Laceration without foreign body of unspecified forearm, initial encounter: Secondary | ICD-10-CM | POA: Diagnosis not present

## 2022-11-17 DIAGNOSIS — Z9181 History of falling: Secondary | ICD-10-CM | POA: Diagnosis not present

## 2022-11-17 DIAGNOSIS — Z23 Encounter for immunization: Secondary | ICD-10-CM | POA: Diagnosis not present

## 2022-11-17 DIAGNOSIS — E782 Mixed hyperlipidemia: Secondary | ICD-10-CM | POA: Diagnosis not present

## 2023-01-31 DIAGNOSIS — Z1231 Encounter for screening mammogram for malignant neoplasm of breast: Secondary | ICD-10-CM | POA: Diagnosis not present

## 2023-02-15 DIAGNOSIS — Z1231 Encounter for screening mammogram for malignant neoplasm of breast: Secondary | ICD-10-CM | POA: Diagnosis not present

## 2023-02-23 DIAGNOSIS — H04123 Dry eye syndrome of bilateral lacrimal glands: Secondary | ICD-10-CM | POA: Diagnosis not present

## 2023-03-30 DIAGNOSIS — H401131 Primary open-angle glaucoma, bilateral, mild stage: Secondary | ICD-10-CM | POA: Diagnosis not present

## 2023-04-19 DIAGNOSIS — F341 Dysthymic disorder: Secondary | ICD-10-CM | POA: Diagnosis not present

## 2023-04-19 DIAGNOSIS — G8929 Other chronic pain: Secondary | ICD-10-CM | POA: Diagnosis not present

## 2023-04-19 DIAGNOSIS — E669 Obesity, unspecified: Secondary | ICD-10-CM | POA: Diagnosis not present

## 2023-04-19 DIAGNOSIS — E559 Vitamin D deficiency, unspecified: Secondary | ICD-10-CM | POA: Diagnosis not present

## 2023-04-19 DIAGNOSIS — E039 Hypothyroidism, unspecified: Secondary | ICD-10-CM | POA: Diagnosis not present

## 2023-04-19 DIAGNOSIS — G2581 Restless legs syndrome: Secondary | ICD-10-CM | POA: Diagnosis not present

## 2023-04-19 DIAGNOSIS — G4733 Obstructive sleep apnea (adult) (pediatric): Secondary | ICD-10-CM | POA: Diagnosis not present

## 2023-04-19 DIAGNOSIS — E538 Deficiency of other specified B group vitamins: Secondary | ICD-10-CM | POA: Diagnosis not present

## 2023-04-19 DIAGNOSIS — G63 Polyneuropathy in diseases classified elsewhere: Secondary | ICD-10-CM | POA: Diagnosis not present

## 2023-04-19 DIAGNOSIS — H353130 Nonexudative age-related macular degeneration, bilateral, stage unspecified: Secondary | ICD-10-CM | POA: Diagnosis not present

## 2023-04-19 DIAGNOSIS — F3341 Major depressive disorder, recurrent, in partial remission: Secondary | ICD-10-CM | POA: Diagnosis not present

## 2023-04-19 DIAGNOSIS — E785 Hyperlipidemia, unspecified: Secondary | ICD-10-CM | POA: Diagnosis not present

## 2023-04-25 DIAGNOSIS — I499 Cardiac arrhythmia, unspecified: Secondary | ICD-10-CM | POA: Diagnosis not present

## 2023-04-25 DIAGNOSIS — R1310 Dysphagia, unspecified: Secondary | ICD-10-CM | POA: Diagnosis not present

## 2023-04-25 DIAGNOSIS — E88819 Insulin resistance, unspecified: Secondary | ICD-10-CM | POA: Diagnosis not present

## 2023-04-25 DIAGNOSIS — K219 Gastro-esophageal reflux disease without esophagitis: Secondary | ICD-10-CM | POA: Diagnosis not present

## 2023-04-25 DIAGNOSIS — K589 Irritable bowel syndrome without diarrhea: Secondary | ICD-10-CM | POA: Diagnosis not present

## 2023-05-04 DIAGNOSIS — H524 Presbyopia: Secondary | ICD-10-CM | POA: Diagnosis not present

## 2023-05-04 DIAGNOSIS — H401131 Primary open-angle glaucoma, bilateral, mild stage: Secondary | ICD-10-CM | POA: Diagnosis not present

## 2023-05-10 DIAGNOSIS — K317 Polyp of stomach and duodenum: Secondary | ICD-10-CM | POA: Diagnosis not present

## 2023-05-10 DIAGNOSIS — K219 Gastro-esophageal reflux disease without esophagitis: Secondary | ICD-10-CM | POA: Diagnosis not present

## 2023-05-10 DIAGNOSIS — R1310 Dysphagia, unspecified: Secondary | ICD-10-CM | POA: Diagnosis not present

## 2023-05-10 DIAGNOSIS — K222 Esophageal obstruction: Secondary | ICD-10-CM | POA: Diagnosis not present

## 2023-05-10 DIAGNOSIS — E049 Nontoxic goiter, unspecified: Secondary | ICD-10-CM | POA: Diagnosis not present

## 2023-05-10 DIAGNOSIS — K449 Diaphragmatic hernia without obstruction or gangrene: Secondary | ICD-10-CM | POA: Diagnosis not present

## 2023-07-12 DIAGNOSIS — Z131 Encounter for screening for diabetes mellitus: Secondary | ICD-10-CM | POA: Diagnosis not present

## 2023-07-25 DIAGNOSIS — W57XXXA Bitten or stung by nonvenomous insect and other nonvenomous arthropods, initial encounter: Secondary | ICD-10-CM | POA: Diagnosis not present

## 2023-07-25 DIAGNOSIS — Z6832 Body mass index (BMI) 32.0-32.9, adult: Secondary | ICD-10-CM | POA: Diagnosis not present

## 2023-07-25 DIAGNOSIS — S40861A Insect bite (nonvenomous) of right upper arm, initial encounter: Secondary | ICD-10-CM | POA: Diagnosis not present

## 2023-08-23 DIAGNOSIS — H401131 Primary open-angle glaucoma, bilateral, mild stage: Secondary | ICD-10-CM | POA: Diagnosis not present

## 2023-11-13 DIAGNOSIS — Z789 Other specified health status: Secondary | ICD-10-CM | POA: Diagnosis not present

## 2023-11-13 DIAGNOSIS — E559 Vitamin D deficiency, unspecified: Secondary | ICD-10-CM | POA: Diagnosis not present

## 2023-11-14 DIAGNOSIS — L820 Inflamed seborrheic keratosis: Secondary | ICD-10-CM | POA: Diagnosis not present

## 2023-11-14 DIAGNOSIS — L821 Other seborrheic keratosis: Secondary | ICD-10-CM | POA: Diagnosis not present

## 2023-11-14 DIAGNOSIS — L578 Other skin changes due to chronic exposure to nonionizing radiation: Secondary | ICD-10-CM | POA: Diagnosis not present

## 2023-11-20 DIAGNOSIS — Z0000 Encounter for general adult medical examination without abnormal findings: Secondary | ICD-10-CM | POA: Diagnosis not present

## 2023-11-20 DIAGNOSIS — Z1331 Encounter for screening for depression: Secondary | ICD-10-CM | POA: Diagnosis not present

## 2023-11-20 DIAGNOSIS — M810 Age-related osteoporosis without current pathological fracture: Secondary | ICD-10-CM | POA: Diagnosis not present

## 2023-11-20 DIAGNOSIS — R7401 Elevation of levels of liver transaminase levels: Secondary | ICD-10-CM | POA: Diagnosis not present

## 2023-11-20 DIAGNOSIS — Z9181 History of falling: Secondary | ICD-10-CM | POA: Diagnosis not present

## 2023-11-20 DIAGNOSIS — E039 Hypothyroidism, unspecified: Secondary | ICD-10-CM | POA: Diagnosis not present

## 2023-11-20 DIAGNOSIS — Z23 Encounter for immunization: Secondary | ICD-10-CM | POA: Diagnosis not present

## 2023-11-20 DIAGNOSIS — K219 Gastro-esophageal reflux disease without esophagitis: Secondary | ICD-10-CM | POA: Diagnosis not present

## 2023-11-20 DIAGNOSIS — K589 Irritable bowel syndrome without diarrhea: Secondary | ICD-10-CM | POA: Diagnosis not present

## 2023-11-20 DIAGNOSIS — E782 Mixed hyperlipidemia: Secondary | ICD-10-CM | POA: Diagnosis not present

## 2023-11-20 DIAGNOSIS — F331 Major depressive disorder, recurrent, moderate: Secondary | ICD-10-CM | POA: Diagnosis not present

## 2023-11-22 DIAGNOSIS — R7401 Elevation of levels of liver transaminase levels: Secondary | ICD-10-CM | POA: Diagnosis not present

## 2023-12-05 DIAGNOSIS — R300 Dysuria: Secondary | ICD-10-CM | POA: Diagnosis not present

## 2023-12-14 DIAGNOSIS — M810 Age-related osteoporosis without current pathological fracture: Secondary | ICD-10-CM | POA: Diagnosis not present

## 2023-12-14 DIAGNOSIS — Z7983 Long term (current) use of bisphosphonates: Secondary | ICD-10-CM | POA: Diagnosis not present

## 2023-12-14 DIAGNOSIS — Z87311 Personal history of (healed) other pathological fracture: Secondary | ICD-10-CM | POA: Diagnosis not present

## 2023-12-14 DIAGNOSIS — Z5181 Encounter for therapeutic drug level monitoring: Secondary | ICD-10-CM | POA: Diagnosis not present

## 2023-12-14 DIAGNOSIS — Z9181 History of falling: Secondary | ICD-10-CM | POA: Diagnosis not present

## 2023-12-14 DIAGNOSIS — Z7189 Other specified counseling: Secondary | ICD-10-CM | POA: Diagnosis not present

## 2023-12-28 DIAGNOSIS — D472 Monoclonal gammopathy: Secondary | ICD-10-CM | POA: Diagnosis not present

## 2024-01-02 DIAGNOSIS — D472 Monoclonal gammopathy: Secondary | ICD-10-CM | POA: Diagnosis not present
